# Patient Record
Sex: Female | Born: 1990 | Race: Black or African American | Hispanic: No | Marital: Single | Smoking: Former smoker
Health system: Southern US, Community
[De-identification: ages and names within clinical notes are randomized; demographics above are authoritative.]

## PROBLEM LIST (undated history)

## (undated) DIAGNOSIS — D259 Leiomyoma of uterus, unspecified: Secondary | ICD-10-CM

## (undated) DIAGNOSIS — Z789 Other specified health status: Secondary | ICD-10-CM

## (undated) DIAGNOSIS — R87629 Unspecified abnormal cytological findings in specimens from vagina: Secondary | ICD-10-CM

## (undated) HISTORY — DX: Unspecified abnormal cytological findings in specimens from vagina: R87.629

---
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
## 2009-01-07 ENCOUNTER — Emergency Department (HOSPITAL_COMMUNITY): Admission: EM | Admit: 2009-01-07 | Discharge: 2009-01-07 | Payer: Self-pay | Admitting: Family Medicine

## 2009-02-18 DIAGNOSIS — R87629 Unspecified abnormal cytological findings in specimens from vagina: Secondary | ICD-10-CM

## 2009-02-18 HISTORY — DX: Unspecified abnormal cytological findings in specimens from vagina: R87.629

## 2009-03-10 ENCOUNTER — Emergency Department (HOSPITAL_COMMUNITY): Admission: EM | Admit: 2009-03-10 | Discharge: 2009-03-10 | Payer: Self-pay | Admitting: Emergency Medicine

## 2009-12-16 ENCOUNTER — Inpatient Hospital Stay (HOSPITAL_COMMUNITY): Admission: AD | Admit: 2009-12-16 | Discharge: 2009-12-16 | Payer: Self-pay | Admitting: Obstetrics and Gynecology

## 2009-12-20 ENCOUNTER — Inpatient Hospital Stay (HOSPITAL_COMMUNITY): Admission: AD | Admit: 2009-12-20 | Discharge: 2009-12-20 | Payer: Self-pay | Admitting: Obstetrics and Gynecology

## 2009-12-22 ENCOUNTER — Inpatient Hospital Stay (HOSPITAL_COMMUNITY): Admission: AD | Admit: 2009-12-22 | Discharge: 2009-12-22 | Payer: Self-pay | Admitting: Obstetrics and Gynecology

## 2009-12-28 ENCOUNTER — Inpatient Hospital Stay (HOSPITAL_COMMUNITY): Admission: AD | Admit: 2009-12-28 | Discharge: 2009-12-31 | Payer: Self-pay | Admitting: Obstetrics and Gynecology

## 2009-12-29 ENCOUNTER — Encounter (INDEPENDENT_AMBULATORY_CARE_PROVIDER_SITE_OTHER): Payer: Self-pay | Admitting: Obstetrics and Gynecology

## 2010-01-01 ENCOUNTER — Encounter: Admission: RE | Admit: 2010-01-01 | Discharge: 2010-01-03 | Payer: Self-pay | Admitting: Physician Assistant

## 2010-05-01 LAB — CBC
HCT: 35.2 % — ABNORMAL LOW (ref 36.0–46.0)
HCT: 44.8 % (ref 36.0–46.0)
MCH: 30.1 pg (ref 26.0–34.0)
MCH: 31.2 pg (ref 26.0–34.0)
MCHC: 32.3 g/dL (ref 30.0–36.0)
MCHC: 33 g/dL (ref 30.0–36.0)
MCV: 93.3 fL (ref 78.0–100.0)
MCV: 94.5 fL (ref 78.0–100.0)
Platelets: 196 10*3/uL (ref 150–400)
RDW: 13.9 % (ref 11.5–15.5)
RDW: 14 % (ref 11.5–15.5)

## 2010-05-02 LAB — CBC
MCH: 30.9 pg (ref 26.0–34.0)
MCHC: 32.8 g/dL (ref 30.0–36.0)
RDW: 14.7 % (ref 11.5–15.5)

## 2010-05-02 LAB — URIC ACID: Uric Acid, Serum: 5.2 mg/dL (ref 2.4–7.0)

## 2010-05-02 LAB — COMPREHENSIVE METABOLIC PANEL
ALT: 15 U/L (ref 0–35)
AST: 22 U/L (ref 0–37)
Calcium: 9.2 mg/dL (ref 8.4–10.5)
Creatinine, Ser: 0.58 mg/dL (ref 0.4–1.2)
GFR calc Af Amer: >60 mL/min (ref >60)
GFR calc non Af Amer: >60 mL/min (ref >60)
Glucose, Bld: 92 mg/dL (ref 70–99)
Sodium: 135 mEq/L (ref 135–145)
Total Protein: 6.1 g/dL (ref 6.0–8.3)

## 2010-05-02 LAB — URINALYSIS, ROUTINE W REFLEX MICROSCOPIC
Bilirubin Urine: NEGATIVE
Nitrite: NEGATIVE
Specific Gravity, Urine: 1.015 (ref 1.005–1.030)
Urobilinogen, UA: 0.2 mg/dL (ref 0.0–1.0)
pH: 6.5 (ref 5.0–8.0)

## 2010-05-23 LAB — POCT URINALYSIS DIP (DEVICE)
Glucose, UA: NEGATIVE mg/dL
Ketones, ur: NEGATIVE mg/dL
Nitrite: NEGATIVE
Protein, ur: NEGATIVE mg/dL
Specific Gravity, Urine: >=1.03 (ref 1.005–1.030)
Urobilinogen, UA: 0.2 mg/dL (ref 0.0–1.0)
pH: 5.5 (ref 5.0–8.0)

## 2010-05-23 LAB — WET PREP, GENITAL: Trich, Wet Prep: NONE SEEN

## 2010-05-23 LAB — GC/CHLAMYDIA PROBE AMP, GENITAL
Chlamydia, DNA Probe: NEGATIVE
GC Probe Amp, Genital: NEGATIVE

## 2010-05-28 LAB — GC/CHLAMYDIA PROBE AMP, GENITAL
Chlamydia, DNA Probe: NEGATIVE
GC Probe Amp, Genital: NEGATIVE

## 2010-05-28 LAB — URINE MICROSCOPIC-ADD ON

## 2010-05-28 LAB — URINALYSIS, ROUTINE W REFLEX MICROSCOPIC
Bilirubin Urine: NEGATIVE
Bilirubin Urine: NEGATIVE
Glucose, UA: NEGATIVE mg/dL
Hgb urine dipstick: NEGATIVE
Ketones, ur: NEGATIVE mg/dL
Nitrite: NEGATIVE
Protein, ur: NEGATIVE mg/dL
Specific Gravity, Urine: 1.031 — ABNORMAL HIGH (ref 1.005–1.030)
Urobilinogen, UA: 1 mg/dL (ref 0.0–1.0)
pH: 5.5 (ref 5.0–8.0)
pH: 6 (ref 5.0–8.0)

## 2010-05-28 LAB — WET PREP, GENITAL: Yeast Wet Prep HPF POC: NONE SEEN

## 2010-05-28 LAB — PREGNANCY, URINE: Preg Test, Ur: NEGATIVE

## 2010-05-28 LAB — POCT PREGNANCY, URINE: Preg Test, Ur: NEGATIVE

## 2011-07-06 ENCOUNTER — Emergency Department (HOSPITAL_BASED_OUTPATIENT_CLINIC_OR_DEPARTMENT_OTHER)
Admission: EM | Admit: 2011-07-06 | Discharge: 2011-07-06 | Disposition: A | Discharge status: Home / Self Care | Payer: 59 | Attending: Emergency Medicine | Admitting: Emergency Medicine

## 2011-07-06 ENCOUNTER — Encounter (HOSPITAL_BASED_OUTPATIENT_CLINIC_OR_DEPARTMENT_OTHER): Payer: Self-pay | Admitting: Emergency Medicine

## 2011-07-06 DIAGNOSIS — B9689 Other specified bacterial agents as the cause of diseases classified elsewhere: Secondary | ICD-10-CM

## 2011-07-06 DIAGNOSIS — N760 Acute vaginitis: Secondary | ICD-10-CM | POA: Insufficient documentation

## 2011-07-06 DIAGNOSIS — A499 Bacterial infection, unspecified: Secondary | ICD-10-CM | POA: Insufficient documentation

## 2011-07-06 DIAGNOSIS — R42 Dizziness and giddiness: Secondary | ICD-10-CM | POA: Insufficient documentation

## 2011-07-06 DIAGNOSIS — R109 Unspecified abdominal pain: Secondary | ICD-10-CM | POA: Insufficient documentation

## 2011-07-06 DIAGNOSIS — R110 Nausea: Secondary | ICD-10-CM | POA: Insufficient documentation

## 2011-07-06 LAB — URINALYSIS, ROUTINE W REFLEX MICROSCOPIC
Bilirubin Urine: NEGATIVE
Glucose, UA: NEGATIVE mg/dL
Hgb urine dipstick: NEGATIVE
Ketones, ur: NEGATIVE mg/dL
Nitrite: NEGATIVE
Protein, ur: NEGATIVE mg/dL
Specific Gravity, Urine: 1.029 (ref 1.005–1.030)
Urobilinogen, UA: 1 mg/dL (ref 0.0–1.0)
pH: 6 (ref 5.0–8.0)

## 2011-07-06 LAB — WET PREP, GENITAL
Trich, Wet Prep: NONE SEEN
Yeast Wet Prep HPF POC: NONE SEEN

## 2011-07-06 LAB — URINE MICROSCOPIC-ADD ON

## 2011-07-06 LAB — PREGNANCY, URINE: Preg Test, Ur: NEGATIVE

## 2011-07-06 MED ORDER — METRONIDAZOLE 500 MG PO TABS: 500.0000 mg | ORAL_TABLET | Freq: Two times a day (BID) | ORAL | Status: AC

## 2011-07-06 MED ORDER — GI COCKTAIL ~~LOC~~
30.0000 mL | Freq: Once | ORAL | Status: AC
  Administered 2011-07-06: 30 mL via ORAL
  Filled 2011-07-06: qty 30

## 2011-07-06 MED ORDER — IBUPROFEN 400 MG PO TABS
600.0000 mg | ORAL_TABLET | Freq: Once | ORAL | Status: AC
  Administered 2011-07-06: 600 mg via ORAL
  Filled 2011-07-06: qty 1

## 2011-07-06 MED ORDER — PROMETHAZINE HCL 25 MG PO TABS: 25.0000 mg | ORAL_TABLET | Freq: Four times a day (QID) | ORAL | Status: DC | PRN

## 2011-07-06 NOTE — ED Notes (Signed)
Pt c/o abd pain (BLQ) & lightheadedness since Tues

## 2011-07-06 NOTE — ED Notes (Signed)
MD at bedside. 

## 2011-07-06 NOTE — Discharge Instructions (Signed)
Bacterial Vaginosis Bacterial vaginosis (BV) is a vaginal infection where the normal balance of bacteria in the vagina is disrupted. The normal balance is then replaced by an overgrowth of certain bacteria. There are several different kinds of bacteria that can cause BV. BV is the most common vaginal infection in women of childbearing age. CAUSES   The cause of BV is not fully understood. BV develops when there is an increase or imbalance of harmful bacteria.   Some activities or behaviors can upset the normal balance of bacteria in the vagina and put women at increased risk including:   Having a new sex partner or multiple sex partners.   Douching.   Using an intrauterine device (IUD) for contraception.   It is not clear what role sexual activity plays in the development of BV. However, women that have never had sexual intercourse are rarely infected with BV.  Women do not get BV from toilet seats, bedding, swimming pools or from touching objects around them.  SYMPTOMS   Grey vaginal discharge.   A fish-like odor with discharge, especially after sexual intercourse.   Itching or burning of the vagina and vulva.   Burning or pain with urination.   Some women have no signs or symptoms at all.  DIAGNOSIS  Your caregiver must examine the vagina for signs of BV. Your caregiver will perform lab tests and look at the sample of vaginal fluid through a microscope. They will look for bacteria and abnormal cells (clue cells), a pH test higher than 4.5, and a positive amine test all associated with BV.  RISKS AND COMPLICATIONS   Pelvic inflammatory disease (PID).   Infections following gynecology surgery.   Developing HIV.   Developing herpes virus.  TREATMENT  Sometimes BV will clear up without treatment. However, all women with symptoms of BV should be treated to avoid complications, especially if gynecology surgery is planned. Female partners generally do not need to be treated. However,  BV may spread between female sex partners so treatment is helpful in preventing a recurrence of BV.   BV may be treated with antibiotics. The antibiotics come in either pill or vaginal cream forms. Either can be used with nonpregnant or pregnant women, but the recommended dosages differ. These antibiotics are not harmful to the baby.   BV can recur after treatment. If this happens, a second round of antibiotics will often be prescribed.   Treatment is important for pregnant women. If not treated, BV can cause a premature delivery, especially for a pregnant woman who had a premature birth in the past. All pregnant women who have symptoms of BV should be checked and treated.   For chronic reoccurrence of BV, treatment with a type of prescribed gel vaginally twice a week is helpful.  HOME CARE INSTRUCTIONS   Finish all medication as directed by your caregiver.   Do not have sex until treatment is completed.   Tell your sexual partner that you have a vaginal infection. They should see their caregiver and be treated if they have problems, such as a mild rash or itching.   Practice safe sex. Use condoms. Only have 1 sex partner.  PREVENTION  Basic prevention steps can help reduce the risk of upsetting the natural balance of bacteria in the vagina and developing BV:  Do not have sexual intercourse (be abstinent).   Do not douche.   Use all of the medicine prescribed for treatment of BV, even if the signs and symptoms go away.     Tell your sex partner if you have BV. That way, they can be treated, if needed, to prevent reoccurrence.  SEEK MEDICAL CARE IF:   Your symptoms are not improving after 3 days of treatment.   You have increased discharge, pain, or fever.  MAKE SURE YOU:   Understand these instructions.   Will watch your condition.   Will get help right away if you are not doing well or get worse.  FOR MORE INFORMATION  Division of STD Prevention (DSTDP), Centers for Disease  Control and Prevention: www.cdc.gov/std American [employer] Association (ASHA): www.ashastd.org  Document Released: 02/04/2005 Document Revised: 01/24/2011 Document Reviewed: 07/28/2008 ExitCare Patient Information 2012 [employer].Abdominal Pain Abdominal pain can be caused by many things. Your caregiver decides the seriousness of your pain by an examination and possibly blood tests and X-rays. Many cases can be observed and treated at home. Most abdominal pain is not caused by a disease and will probably improve without treatment. However, in many cases, more time must pass before a clear cause of the pain can be found. Before that point, it may not be known if you need more testing, or if hospitalization or surgery is needed. HOME CARE INSTRUCTIONS   Do not take laxatives unless directed by your caregiver.   Take pain medicine only as directed by your caregiver.   Only take over-the-counter or prescription medicines for pain, discomfort, or fever as directed by your caregiver.   Try a clear liquid diet (broth, tea, or water) for as long as directed by your caregiver. Slowly move to a bland diet as tolerated.  SEEK IMMEDIATE MEDICAL CARE IF:   The pain does not go away.   You have a fever.   You keep throwing up (vomiting).   The pain is felt only in portions of the abdomen. Pain in the right side could possibly be appendicitis. In an adult, pain in the left lower portion of the abdomen could be colitis or diverticulitis.   You pass bloody or black tarry stools.  MAKE SURE YOU:   Understand these instructions.   Will watch your condition.   Will get help right away if you are not doing well or get worse.  Document Released: 11/14/2004 Document Revised: 01/24/2011 Document Reviewed: 09/23/2007 ExitCare Patient Information 2012 [employer]. 

## 2011-07-06 NOTE — ED Provider Notes (Signed)
History     CSN: [id number]  Arrival date & time 07/06/11  0848   First MD Initiated Contact with Patient 07/06/11 [phone]      Chief Complaint  Patient presents with  . Abdominal Pain  . Dizziness    (Consider location/radiation/quality/duration/timing/severity/associated sxs/prior treatment) HPI Comments: Pain not relieved by tylenol.  Crampy, not worse with eating.  She had sexual intercourse with new partner about 1 week ago, she is concerned for STD after researching her symptoms on Google.  No fevers or chills.  No dysuria, frequency.  Patient is a 21 y.o. female presenting with abdominal pain. The history is provided by the patient.  Abdominal Pain The primary symptoms of the illness include abdominal pain and nausea. The primary symptoms of the illness do not include fever, vomiting, dysuria, vaginal discharge or vaginal bleeding. The current episode started more than 2 days ago. The onset of the illness was gradual. The problem has not changed since onset. The abdominal pain is relieved by nothing.  Symptoms associated with the illness do not include chills, constipation, frequency or back pain.    History reviewed. No pertinent past medical history.  History reviewed. No pertinent past surgical history.  History reviewed. No pertinent family history.  History  Substance Use Topics  . Smoking status: Never Smoker   . Smokeless tobacco: Not on file  . Alcohol Use: No    OB History    Grav Para Term Preterm Abortions TAB SAB Ect Mult Living                  Review of Systems  Constitutional: Negative for fever and chills.  Gastrointestinal: Positive for nausea and abdominal pain. Negative for vomiting, constipation and blood in stool.  Genitourinary: Negative for dysuria, frequency, flank pain, vaginal bleeding, vaginal discharge and difficulty urinating.  Musculoskeletal: Negative for back pain.  Skin: Negative for rash.  Neurological: Negative for headaches.     Allergies  Review of patient's allergies indicates no known allergies.  Home Medications   Current Outpatient Rx  Name Route Sig Dispense Refill  . METRONIDAZOLE 500 MG PO TABS Oral Take 1 tablet (500 mg total) by mouth 2 (two) times daily. 14 tablet 0  . PROMETHAZINE HCL 25 MG PO TABS Oral Take 1 tablet (25 mg total) by mouth every 6 (six) hours as needed for nausea. 20 tablet 0    BP 116/61  Pulse 71  Temp(Src) 98.3 F (36.8 C) (Oral)  Resp 14  Ht 5\' 6"  (1.676 m)  Wt 130 lb (58.968 kg)  BMI 20.98 kg/m2  SpO2 99%  LMP 06/10/2011  Physical Exam  Nursing note and vitals reviewed. Constitutional: She is oriented to person, place, and time. She appears well-developed and well-nourished. No distress.  Eyes: No scleral icterus.  Pulmonary/Chest: Effort normal.  Abdominal: Soft. Bowel sounds are normal. She exhibits no distension. There is no tenderness. There is no rebound and no guarding.  Genitourinary: Pelvic exam was performed with patient prone. There is no lesion on the right labia. There is no lesion on the left labia. Uterus is not tender. Cervix exhibits discharge. Cervix exhibits no motion tenderness and no friability. Right adnexum displays no tenderness. Left adnexum displays no tenderness. No tenderness around the vagina. Vaginal discharge found.  Neurological: She is alert and oriented to person, place, and time.  Skin: Skin is warm and dry. No rash noted.    ED Course  Procedures (including critical care time)  Labs Reviewed  URINALYSIS, ROUTINE W REFLEX MICROSCOPIC - Abnormal; Notable for the following:    APPearance CLOUDY (*)    Leukocytes, UA SMALL (*)    All other components within normal limits  URINE MICROSCOPIC-ADD ON - Abnormal; Notable for the following:    Squamous Epithelial / LPF MANY (*)    Bacteria, UA MANY (*)    All other components within normal limits  WET PREP, GENITAL - Abnormal; Notable for the following:    Clue Cells Wet Prep HPF  POC TOO NUMEROUS TO COUNT (*)    WBC, Wet Prep HPF POC MANY (*)    All other components within normal limits  PREGNANCY, URINE  GC/CHLAMYDIA PROBE AMP, GENITAL   No results found.   1. BV (bacterial vaginosis)   2. Abdominal pain       MDM  No sig tenderness on bimanual.  Slight discharge present, wet prep shows BV.  Abd is otherwise soft, no guard or rebound.  No fever, VS normal.  Pt is reassured, non specific abd pain.  Flagyl prescribed for BV and pt can follow up with PCP.          Gavin Pound. Oletta Lamas, MD 07/06/11 2015

## 2011-07-06 NOTE — ED Notes (Signed)
Pelvic cart at bedside.  Pt in a gown.

## 2011-07-08 LAB — GC/CHLAMYDIA PROBE AMP, GENITAL
Chlamydia, DNA Probe: NEGATIVE
GC Probe Amp, Genital: NEGATIVE

## 2012-09-30 ENCOUNTER — Emergency Department (HOSPITAL_BASED_OUTPATIENT_CLINIC_OR_DEPARTMENT_OTHER)
Admission: EM | Admit: 2012-09-30 | Discharge: 2012-09-30 | Disposition: A | Discharge status: Home / Self Care | Payer: 59 | Attending: Emergency Medicine | Admitting: Emergency Medicine

## 2012-09-30 ENCOUNTER — Encounter (HOSPITAL_BASED_OUTPATIENT_CLINIC_OR_DEPARTMENT_OTHER): Payer: Self-pay | Admitting: *Deleted

## 2012-09-30 DIAGNOSIS — N898 Other specified noninflammatory disorders of vagina: Secondary | ICD-10-CM | POA: Insufficient documentation

## 2012-09-30 DIAGNOSIS — Z3202 Encounter for pregnancy test, result negative: Secondary | ICD-10-CM | POA: Insufficient documentation

## 2012-09-30 DIAGNOSIS — A499 Bacterial infection, unspecified: Secondary | ICD-10-CM | POA: Insufficient documentation

## 2012-09-30 DIAGNOSIS — N760 Acute vaginitis: Secondary | ICD-10-CM | POA: Insufficient documentation

## 2012-09-30 DIAGNOSIS — B9689 Other specified bacterial agents as the cause of diseases classified elsewhere: Secondary | ICD-10-CM | POA: Insufficient documentation

## 2012-09-30 DIAGNOSIS — N949 Unspecified condition associated with female genital organs and menstrual cycle: Secondary | ICD-10-CM | POA: Insufficient documentation

## 2012-09-30 LAB — URINALYSIS, ROUTINE W REFLEX MICROSCOPIC
Nitrite: NEGATIVE
Specific Gravity, Urine: 1.021 (ref 1.005–1.030)
pH: 6 (ref 5.0–8.0)

## 2012-09-30 LAB — URINE MICROSCOPIC-ADD ON

## 2012-09-30 LAB — PREGNANCY, URINE: Preg Test, Ur: NEGATIVE

## 2012-09-30 MED ORDER — METRONIDAZOLE 500 MG PO TABS: 500.0000 mg | ORAL_TABLET | Freq: Two times a day (BID) | ORAL | Status: DC

## 2012-09-30 NOTE — ED Notes (Signed)
Pt noticed discharge and abdominal pain 1 month ago and recently her boyfriend was diagnosed with nongonococcal urethritis and she was told to come to the ER.

## 2012-09-30 NOTE — ED Provider Notes (Signed)
Medical screening examination/treatment/procedure(s) were performed by non-physician practitioner and as supervising physician I was immediately available for consultation/collaboration.   Glynn Octave, MD 09/30/12 [phone]

## 2012-09-30 NOTE — ED Provider Notes (Signed)
CSN: [id number]     Arrival date & time 09/30/12  1919 History     First MD Initiated Contact with Patient 09/30/12 2001     Chief Complaint  Patient presents with  . Vaginal Discharge  . Abdominal Pain   (Consider location/radiation/quality/duration/timing/severity/associated sxs/prior Treatment) HPI Comments: Patient is a 22 year old female who presents with a 1 month history of abdominal pain. The pain is located in her lower abdomen and does not radiate. The pain is described as cramping and severe. The pain started gradually and progressively worsened since the onset. No alleviating/aggravating factors. The patient has tried nothing for symptoms without relief. Associated symptoms include vaginal bleeding, vaginal discharge. Patient denies fever, headache, NVD, chest pain, SOB, dysuria, constipation.  Patient is a 22 y.o. female presenting with vaginal discharge and abdominal pain.  Vaginal Discharge Associated symptoms: abdominal pain   Abdominal Pain Associated symptoms: vaginal discharge     History reviewed. No pertinent past medical history. History reviewed. No pertinent past surgical history. History reviewed. No pertinent family history. History  Substance Use Topics  . Smoking status: Never Smoker   . Smokeless tobacco: Not on file  . Alcohol Use: No   OB History   Grav Para Term Preterm Abortions TAB SAB Ect Mult Living                 Review of Systems  Gastrointestinal: Positive for abdominal pain.  Genitourinary: Positive for vaginal discharge and pelvic pain.  All other systems reviewed and are negative.    Allergies  Review of patient's allergies indicates no known allergies.  Home Medications   Current Outpatient Rx  Name  Route  Sig  Dispense  Refill  . EXPIRED: promethazine (PHENERGAN) 25 MG tablet   Oral   Take 1 tablet (25 mg total) by mouth every 6 (six) hours as needed for nausea.   20 tablet   0    BP 121/71  Pulse 77  Temp(Src) 99  F (37.2 C) (Oral)  Resp 16  SpO2 100%  LMP 09/07/2012 Physical Exam  Nursing note and vitals reviewed. Constitutional: She is oriented to person, place, and time. She appears well-developed and well-nourished. No distress.  HENT:  Head: Normocephalic and atraumatic.  Eyes: Conjunctivae and EOM are normal.  Neck: Normal range of motion.  Cardiovascular: Normal rate and regular rhythm.  Exam reveals no gallop and no friction rub.   No murmur heard. Pulmonary/Chest: Effort normal and breath sounds normal. She has no wheezes. She has no rales. She exhibits no tenderness.  Abdominal: Soft. She exhibits no distension. There is tenderness. There is no rebound and no guarding.  Suprapubic tenderness to palpation.   Genitourinary: Vagina normal.  Moderate amount of blood in vagina. Cervical os closed. No CMT. Abdominal tenderness to palpation on bimanual exam. No abnormal masses palpated.    Musculoskeletal: Normal range of motion.  Neurological: She is alert and oriented to person, place, and time. Coordination normal.  Speech is goal-oriented. Moves limbs without ataxia.   Skin: Skin is warm and dry.  Psychiatric: She has a normal mood and affect. Her behavior is normal.    ED Course   Procedures (including critical care time)  Labs Reviewed  WET PREP, GENITAL - Abnormal; Notable for the following:    Clue Cells Wet Prep HPF POC FEW (*)    WBC, Wet Prep HPF POC FEW (*)    All other components within normal limits  URINALYSIS, ROUTINE W REFLEX  MICROSCOPIC - Abnormal; Notable for the following:    Hgb urine dipstick LARGE (*)    Leukocytes, UA SMALL (*)    All other components within normal limits  URINE MICROSCOPIC-ADD ON - Abnormal; Notable for the following:    Squamous Epithelial / LPF FEW (*)    Bacteria, UA FEW (*)    All other components within normal limits  GC/CHLAMYDIA PROBE AMP  URINE CULTURE  PREGNANCY, URINE   No results found. 1. BV (bacterial vaginosis)      MDM  9:47 PM Patient's wet prep shows clue cells. Hemoglobin in urine due to menstrual cycle. Vitals stable and patient afebrile. I will treat patient with flagyl. No further evaluation needed at this time.     Emilia Beck, PA-C 09/30/12 2152

## 2012-10-01 LAB — URINE CULTURE: Colony Count: NO GROWTH

## 2012-10-01 LAB — GC/CHLAMYDIA PROBE AMP: GC Probe RNA: NEGATIVE

## 2014-10-31 ENCOUNTER — Encounter (HOSPITAL_BASED_OUTPATIENT_CLINIC_OR_DEPARTMENT_OTHER): Payer: Self-pay | Admitting: *Deleted

## 2014-10-31 ENCOUNTER — Emergency Department (HOSPITAL_BASED_OUTPATIENT_CLINIC_OR_DEPARTMENT_OTHER)
Admission: EM | Admit: 2014-10-31 | Discharge: 2014-10-31 | Disposition: A | Discharge status: Home / Self Care | Payer: BLUE CROSS/BLUE SHIELD | Attending: Emergency Medicine | Admitting: Emergency Medicine

## 2014-10-31 DIAGNOSIS — R1013 Epigastric pain: Secondary | ICD-10-CM | POA: Diagnosis not present

## 2014-10-31 DIAGNOSIS — Z79899 Other long term (current) drug therapy: Secondary | ICD-10-CM | POA: Diagnosis not present

## 2014-10-31 DIAGNOSIS — R109 Unspecified abdominal pain: Secondary | ICD-10-CM | POA: Diagnosis present

## 2014-10-31 LAB — COMPREHENSIVE METABOLIC PANEL
ALK PHOS: 45 U/L (ref 38–126)
ALT: 15 U/L (ref 14–54)
ANION GAP: 7 (ref 5–15)
AST: 19 U/L (ref 15–41)
Albumin: 3.8 g/dL (ref 3.5–5.0)
BUN: 11 mg/dL (ref 6–20)
CALCIUM: 9.7 mg/dL (ref 8.9–10.3)
CHLORIDE: 102 mmol/L (ref 101–111)
CO2: 27 mmol/L (ref 22–32)
Creatinine, Ser: 0.72 mg/dL (ref 0.44–1.00)
GFR calc non Af Amer: >60 mL/min (ref >60)
Glucose, Bld: 81 mg/dL (ref 65–99)
Potassium: 3.7 mmol/L (ref 3.5–5.1)
SODIUM: 136 mmol/L (ref 135–145)
Total Bilirubin: 0.4 mg/dL (ref 0.3–1.2)
Total Protein: 7.4 g/dL (ref 6.5–8.1)

## 2014-10-31 LAB — CBC
HCT: 37 % (ref 36.0–46.0)
Hemoglobin: 12.2 g/dL (ref 12.0–15.0)
MCH: 28 pg (ref 26.0–34.0)
MCHC: 33 g/dL (ref 30.0–36.0)
MCV: 85.1 fL (ref 78.0–100.0)
PLATELETS: 250 10*3/uL (ref 150–400)
RBC: 4.35 MIL/uL (ref 3.87–5.11)
RDW: 11.9 % (ref 11.5–15.5)
WBC: 6.9 10*3/uL (ref 4.0–10.5)

## 2014-10-31 LAB — LIPASE, BLOOD: Lipase: 37 U/L (ref 22–51)

## 2014-10-31 MED ORDER — OMEPRAZOLE 20 MG PO CPDR: 20.0000 mg | DELAYED_RELEASE_CAPSULE | Freq: Every day | ORAL | Status: DC

## 2014-10-31 MED ORDER — GI COCKTAIL ~~LOC~~
30.0000 mL | Freq: Once | ORAL | Status: AC
  Administered 2014-10-31: 30 mL via ORAL
  Filled 2014-10-31: qty 30

## 2014-10-31 MED ORDER — SUCRALFATE 1 GM/10ML PO SUSP: 1.0000 g | Freq: Three times a day (TID) | ORAL | Status: DC

## 2014-10-31 NOTE — ED Notes (Signed)
Epigastric pain x 3 days. She took tums with no relief. Feels like she has a knot in her upper abdomen.

## 2014-10-31 NOTE — ED Provider Notes (Signed)
CSN: [id number]     Arrival date & time 10/31/14  1812 History   First MD Initiated Contact with Patient 10/31/14 1822     Chief Complaint  Patient presents with  . Abdominal Pain     (Consider location/radiation/quality/duration/timing/severity/associated sxs/prior Treatment) HPI Comments: Patient presents to the emergency department with chief complaint of epigastric abdominal pain. She states that the symptoms started approximately 3 days ago. She states that she has taken toms with good relief. However she states that the symptoms return in the morning. She denies any association with eating or drinking. She denies any nausea or vomiting. Denies any burning sensation in her throat. She denies aggravating or alleviating factors. She denies any fevers or chills. Denies any prior abdominal surgeries.  The history is provided by the patient. No language interpreter was used.    History reviewed. No pertinent past medical history. History reviewed. No pertinent past surgical history. No family history on file. Social History  Substance Use Topics  . Smoking status: Never Smoker   . Smokeless tobacco: None  . Alcohol Use: No   OB History    No data available     Review of Systems  Constitutional: Negative for fever and chills.  Respiratory: Negative for shortness of breath.   Cardiovascular: Negative for chest pain.  Gastrointestinal: Positive for abdominal pain. Negative for nausea, vomiting, diarrhea and constipation.  Genitourinary: Negative for dysuria.      Allergies  Review of patient's allergies indicates no known allergies.  Home Medications   Prior to Admission medications   Medication Sig Start Date End Date Taking? Authorizing Provider  metroNIDAZOLE (FLAGYL) 500 MG tablet Take 1 tablet (500 mg total) by mouth 2 (two) times daily. 09/30/12   Kaitlyn Szekalski, PA-C  promethazine (PHENERGAN) 25 MG tablet Take 1 tablet (25 mg total) by mouth every 6 (six) hours as  needed for nausea. 07/06/11 07/13/11  Kingsley Spittle, MD   BP 115/66 mmHg  Pulse 88  Temp(Src) 98.7 F (37.1 C) (Oral)  Resp 16  Ht 5' 5.5" (1.664 m)  Wt 130 lb (58.968 kg)  BMI 21.30 kg/m2  SpO2 100%  LMP 09/20/2014 Physical Exam  Constitutional: She is oriented to person, place, and time. She appears well-developed and well-nourished.  HENT:  Head: Normocephalic and atraumatic.  Eyes: Conjunctivae and EOM are normal. Pupils are equal, round, and reactive to light.  Neck: Normal range of motion. Neck supple.  Cardiovascular: Normal rate and regular rhythm.  Exam reveals no gallop and no friction rub.   No murmur heard. Pulmonary/Chest: Effort normal and breath sounds normal. No respiratory distress. She has no wheezes. She has no rales. She exhibits no tenderness.  Abdominal: Soft. Bowel sounds are normal. She exhibits no distension and no mass. There is no tenderness. There is no rebound and no guarding.  No focal abdominal tenderness, no RLQ tenderness or pain at McBurney's point, no RUQ tenderness or Murphy's sign, no left-sided abdominal tenderness, no fluid wave, or signs of peritonitis   Musculoskeletal: Normal range of motion. She exhibits no edema or tenderness.  Neurological: She is alert and oriented to person, place, and time.  Skin: Skin is warm and dry.  Psychiatric: She has a normal mood and affect. Her behavior is normal. Judgment and thought content normal.  Nursing note and vitals reviewed.   ED Course  Procedures (including critical care time) Results for orders placed or performed during the hospital encounter of 10/31/14  CBC  Result Value Ref  Range   WBC 6.9 4.0 - 10.5 K/uL   RBC 4.35 3.87 - 5.11 MIL/uL   Hemoglobin 12.2 12.0 - 15.0 g/dL   HCT 37.0 36.0 - 46.0 %   MCV 85.1 78.0 - 100.0 fL   MCH 28.0 26.0 - 34.0 pg   MCHC 33.0 30.0 - 36.0 g/dL   RDW 11.9 11.5 - 15.5 %   Platelets 250 150 - 400 K/uL  Comprehensive metabolic panel  Result Value Ref Range    Sodium 136 135 - 145 mmol/L   Potassium 3.7 3.5 - 5.1 mmol/L   Chloride 102 101 - 111 mmol/L   CO2 27 22 - 32 mmol/L   Glucose, Bld 81 65 - 99 mg/dL   BUN 11 6 - 20 mg/dL   Creatinine, Ser 0.72 0.44 - 1.00 mg/dL   Calcium 9.7 8.9 - 10.3 mg/dL   Total Protein 7.4 6.5 - 8.1 g/dL   Albumin 3.8 3.5 - 5.0 g/dL   AST 19 15 - 41 U/L   ALT 15 14 - 54 U/L   Alkaline Phosphatase 45 38 - 126 U/L   Total Bilirubin 0.4 0.3 - 1.2 mg/dL   GFR calc non Af Amer >60 >60 mL/min   GFR calc Af Amer >60 >60 mL/min   Anion gap 7 5 - 15  Lipase, blood  Result Value Ref Range   Lipase 37 22 - 51 U/L   No results found.    MDM   Final diagnoses:  Epigastric pain    Patient with epigastric abdominal pain. Symptoms 3 days. Symptoms seem consistent with GERD/PUD disease. Will check lipase and liver function tests. Will give GI cocktail. Will reassess. Abdomen is soft and nontender. She is not in any apparent distress.  Labs are reassuring. Will treat with Carafate and omeprazole. Abdomen is soft and nontender, no evidence of surgical abdomen. Return precautions discussed.    Montine Circle, PA-C 10/31/14 2034  Blanchie Dessert, MD 11/01/14 [phone]

## 2014-10-31 NOTE — Discharge Instructions (Signed)
Abdominal Pain Many things can cause abdominal pain. Usually, abdominal pain is not caused by a disease and will improve without treatment. It can often be observed and treated at home. Your health care provider will do a physical exam and possibly order blood tests and X-rays to help determine the seriousness of your pain. However, in many cases, more time must pass before a clear cause of the pain can be found. Before that point, your health care provider may not know if you need more testing or further treatment. HOME CARE INSTRUCTIONS  Monitor your abdominal pain for any changes. The following actions may help to alleviate any discomfort you are experiencing:  Only take over-the-counter or prescription medicines as directed by your health care provider.  Do not take laxatives unless directed to do so by your health care provider.  Try a clear liquid diet (broth, tea, or water) as directed by your health care provider. Slowly move to a bland diet as tolerated. SEEK MEDICAL CARE IF:  You have unexplained abdominal pain.  You have abdominal pain associated with nausea or diarrhea.  You have pain when you urinate or have a bowel movement.  You experience abdominal pain that wakes you in the night.  You have abdominal pain that is worsened or improved by eating food.  You have abdominal pain that is worsened with eating fatty foods.  You have a fever. SEEK IMMEDIATE MEDICAL CARE IF:   Your pain does not go away within 2 hours.  You keep throwing up (vomiting).  Your pain is felt only in portions of the abdomen, such as the right side or the left lower portion of the abdomen.  You pass bloody or black tarry stools. MAKE SURE YOU:  Understand these instructions.   Will watch your condition.   Will get help right away if you are not doing well or get worse.  Document Released: 11/14/2004 Document Revised: 02/09/2013 Document Reviewed: 10/14/2012 [facility] Patient Information  2015 [place], [place]. This information is not intended to replace advice given to you by your health care provider. Make sure you discuss any questions you have with your health care provider. Food Choices for Peptic Ulcer Disease When you have peptic ulcer disease, the foods you eat and your eating habits are very important. Choosing the right foods can help ease the discomfort of peptic ulcer disease. WHAT GENERAL GUIDELINES DO I NEED TO FOLLOW?  Choose fruits, vegetables, whole grains, and low-fat meat, fish, and poultry.   Keep a food diary to identify foods that cause symptoms.  Avoid foods that cause irritation or pain. These may be different for different people.  Eat frequent small meals instead of three large meals each day. The pain may be worse when your stomach is empty.  Avoid eating close to bedtime. WHAT FOODS ARE NOT RECOMMENDED? The following are some foods and drinks that may worsen your symptoms:  Black, white, and red pepper.  Hot sauce.  Chili peppers.  Chili powder.  Chocolate and cocoa.   Alcohol.  Tea, coffee, and cola (regular and decaffeinated). The items listed above may not be a complete list of foods and beverages to avoid. Contact your dietitian for more information. Document Released: 04/29/2011 Document Revised: 02/09/2013 Document Reviewed: 12/09/2012 [facility] Patient Information 2015 [place], [place]. This information is not intended to replace advice given to you by your health care provider. Make sure you discuss any questions you have with your health care provider. Peptic Ulcer A peptic ulcer is  a sore in the lining of your esophagus (esophageal ulcer), stomach (gastric ulcer), or in the first part of your small intestine (duodenal ulcer). The ulcer causes erosion into the deeper tissue. CAUSES  Normally, the lining of the stomach and the small intestine protects itself from the acid that digests food. The protective lining can be damaged  by:  An infection caused by a bacterium called Helicobacter pylori (H. pylori).  Regular use of nonsteroidal anti-inflammatory drugs (NSAIDs), such as ibuprofen or aspirin.  Smoking tobacco. Other risk factors include being older than 63, drinking alcohol excessively, and having a family history of ulcer disease.  SYMPTOMS   Burning pain or gnawing in the area between the chest and the belly button.  Heartburn.  Nausea and vomiting.  Bloating. The pain can be worse on an empty stomach and at night. If the ulcer results in bleeding, it can cause:  Black, tarry stools.  Vomiting of bright red blood.  Vomiting of coffee-ground-looking materials. DIAGNOSIS  A diagnosis is usually made based upon your history and an exam. Other tests and procedures may be performed to find the cause of the ulcer. Finding a cause will help determine the best treatment. Tests and procedures may include:  Blood tests, stool tests, or breath tests to check for the bacterium H. pylori.  An upper gastrointestinal (GI) series of the esophagus, stomach, and small intestine.  An endoscopy to examine the esophagus, stomach, and small intestine.  A biopsy. TREATMENT  Treatment may include:  Eliminating the cause of the ulcer, such as smoking, NSAIDs, or alcohol.  Medicines to reduce the amount of acid in your digestive tract.  Antibiotic medicines if the ulcer is caused by the H. pylori bacterium.  An upper endoscopy to treat a bleeding ulcer.  Surgery if the bleeding is severe or if the ulcer created a hole somewhere in the digestive system. HOME CARE INSTRUCTIONS   Avoid tobacco, alcohol, and caffeine. Smoking can increase the acid in the stomach, and continued smoking will impair the healing of ulcers.  Avoid foods and drinks that seem to cause discomfort or aggravate your ulcer.  Only take medicines as directed by your caregiver. Do not substitute over-the-counter medicines for prescription  medicines without talking to your caregiver.  Keep any follow-up appointments and tests as directed. SEEK MEDICAL CARE IF:   Your do not improve within 7 days of starting treatment.  You have ongoing indigestion or heartburn. SEEK IMMEDIATE MEDICAL CARE IF:   You have sudden, sharp, or persistent abdominal pain.  You have bloody or dark black, tarry stools.  You vomit blood or vomit that looks like coffee grounds.  You become light-headed, weak, or feel faint.  You become sweaty or clammy. MAKE SURE YOU:   Understand these instructions.  Will watch your condition.  Will get help right away if you are not doing well or get worse. Document Released: 02/02/2000 Document Revised: 06/21/2013 Document Reviewed: 09/04/2011 [facility] Patient Information 2015 [place], [place]. This information is not intended to replace advice given to you by your health care provider. Make sure you discuss any questions you have with your health care provider.

## 2015-05-28 ENCOUNTER — Encounter (HOSPITAL_BASED_OUTPATIENT_CLINIC_OR_DEPARTMENT_OTHER): Payer: Self-pay

## 2015-05-28 ENCOUNTER — Emergency Department (HOSPITAL_BASED_OUTPATIENT_CLINIC_OR_DEPARTMENT_OTHER)
Admission: EM | Admit: 2015-05-28 | Discharge: 2015-05-28 | Disposition: A | Discharge status: Home / Self Care | Payer: BLUE CROSS/BLUE SHIELD | Attending: Emergency Medicine | Admitting: Emergency Medicine

## 2015-05-28 DIAGNOSIS — Z3202 Encounter for pregnancy test, result negative: Secondary | ICD-10-CM | POA: Insufficient documentation

## 2015-05-28 DIAGNOSIS — N898 Other specified noninflammatory disorders of vagina: Secondary | ICD-10-CM | POA: Insufficient documentation

## 2015-05-28 DIAGNOSIS — Z79899 Other long term (current) drug therapy: Secondary | ICD-10-CM | POA: Insufficient documentation

## 2015-05-28 DIAGNOSIS — Z792 Long term (current) use of antibiotics: Secondary | ICD-10-CM | POA: Diagnosis not present

## 2015-05-28 DIAGNOSIS — N390 Urinary tract infection, site not specified: Secondary | ICD-10-CM | POA: Diagnosis not present

## 2015-05-28 DIAGNOSIS — Z202 Contact with and (suspected) exposure to infections with a predominantly sexual mode of transmission: Secondary | ICD-10-CM | POA: Diagnosis not present

## 2015-05-28 LAB — URINALYSIS, ROUTINE W REFLEX MICROSCOPIC
Bilirubin Urine: NEGATIVE
GLUCOSE, UA: NEGATIVE mg/dL
Hgb urine dipstick: NEGATIVE
KETONES UR: NEGATIVE mg/dL
Nitrite: POSITIVE — AB
PH: 6 (ref 5.0–8.0)
Protein, ur: NEGATIVE mg/dL
Specific Gravity, Urine: 1.025 (ref 1.005–1.030)

## 2015-05-28 LAB — URINE MICROSCOPIC-ADD ON: RBC / HPF: NONE SEEN RBC/hpf (ref 0–5)

## 2015-05-28 LAB — PREGNANCY, URINE: Preg Test, Ur: NEGATIVE

## 2015-05-28 MED ORDER — AZITHROMYCIN 250 MG PO TABS
1000.0000 mg | ORAL_TABLET | Freq: Once | ORAL | Status: AC
  Administered 2015-05-28: 1000 mg via ORAL
  Filled 2015-05-28: qty 4

## 2015-05-28 MED ORDER — LIDOCAINE HCL (PF) 1 % IJ SOLN
INTRAMUSCULAR | Status: AC
  Administered 2015-05-28: 13:00:00
  Filled 2015-05-28: qty 5

## 2015-05-28 MED ORDER — CEFTRIAXONE SODIUM 250 MG IJ SOLR
250.0000 mg | Freq: Once | INTRAMUSCULAR | Status: AC
  Administered 2015-05-28: 250 mg via INTRAMUSCULAR
  Filled 2015-05-28: qty 250

## 2015-05-28 MED ORDER — NITROFURANTOIN MONOHYD MACRO 100 MG PO CAPS
100.0000 mg | ORAL_CAPSULE | Freq: Two times a day (BID) | ORAL | Status: DC
Start: 2015-05-28 — End: 2015-09-27

## 2015-05-28 NOTE — ED Notes (Signed)
Pt reports lower abdominal pain since Friday. Reports boyfriend told her that he had chlamydia. Pt sts foul odor. Denies discharge or nausea.

## 2015-05-28 NOTE — ED Notes (Signed)
EDP at bedside  

## 2015-05-28 NOTE — ED Provider Notes (Signed)
CSN: [id number]     Arrival date & time 05/28/15  1132 History   First MD Initiated Contact with Patient 05/28/15 1140     Chief Complaint  Patient presents with  . Exposure to STD  . Abdominal Pain     (Consider location/radiation/quality/duration/timing/severity/associated sxs/prior Treatment) HPI Patient presents to the emergency department with dysuria along with vaginal discharge.  The patient states that her boyfriend states that he was tested positive for chlamydia.  Patient states that nothing seems to make the condition better or worse.The patient denies chest pain, shortness of breath, headache,blurred vision, neck pain, fever, cough, weakness, numbness, dizziness, anorexia, edema, nausea, vomiting, diarrhea, rash, back pain, hematemesis, bloody stool, near syncope, or syncope.    History reviewed. No pertinent past medical history. History reviewed. No pertinent past surgical history. No family history on file. Social History  Substance Use Topics  . Smoking status: Never Smoker   . Smokeless tobacco: None  . Alcohol Use: No   OB History    No data available     Review of Systems  All other systems negative except as documented in the HPI. All pertinent positives and negatives as reviewed in the HPI.  Allergies  Review of patient's allergies indicates no known allergies.  Home Medications   Prior to Admission medications   Medication Sig Start Date End Date Taking? Authorizing Provider  metroNIDAZOLE (FLAGYL) 500 MG tablet Take 1 tablet (500 mg total) by mouth 2 (two) times daily. 09/30/12   Kaitlyn Szekalski, PA-C  omeprazole (PRILOSEC) 20 MG capsule Take 1 capsule (20 mg total) by mouth daily. 10/31/14   Montine Circle, PA-C  promethazine (PHENERGAN) 25 MG tablet Take 1 tablet (25 mg total) by mouth every 6 (six) hours as needed for nausea. 07/06/11 07/13/11  Kingsley Spittle, MD  sucralfate (CARAFATE) 1 GM/10ML suspension Take 10 mLs (1 g total) by mouth 4 (four) times  daily -  with meals and at bedtime. 10/31/14   Montine Circle, PA-C   BP 133/101 mmHg  Pulse 110  Temp(Src) 99.3 F (37.4 C) (Oral)  Resp 18  Ht 5\' 5"  (1.651 m)  Wt 65.772 kg  BMI 24.13 kg/m2  SpO2 100%  LMP 05/21/2015 Physical Exam  Constitutional: She is oriented to person, place, and time. She appears well-developed and well-nourished. No distress.  HENT:  Head: Normocephalic and atraumatic.  Mouth/Throat: Oropharynx is clear and moist.  Eyes: Pupils are equal, round, and reactive to light.  Neck: Normal range of motion. Neck supple.  Cardiovascular: Normal rate, regular rhythm and normal heart sounds.  Exam reveals no gallop and no friction rub.   No murmur heard. Pulmonary/Chest: Effort normal and breath sounds normal. No respiratory distress. She has no wheezes.  Abdominal: Soft. Bowel sounds are normal. She exhibits no distension. There is tenderness. There is no rebound and no guarding.    Genitourinary:  Patient refuses pelvic exam and would just like treatment for STDs  Neurological: She is alert and oriented to person, place, and time. She exhibits normal muscle tone. Coordination normal.  Skin: Skin is warm and dry. No rash noted. No erythema.  Psychiatric: She has a normal mood and affect. Her behavior is normal.  Nursing note and vitals reviewed.   ED Course  Procedures (including critical care time) Labs Review Labs Reviewed  URINALYSIS, ROUTINE W REFLEX MICROSCOPIC (NOT AT [facility]) - Abnormal; Notable for the following:    Color, Urine AMBER (*)    APPearance CLOUDY (*)  Nitrite POSITIVE (*)    Leukocytes, UA SMALL (*)    All other components within normal limits  URINE MICROSCOPIC-ADD ON - Abnormal; Notable for the following:    Squamous Epithelial / LPF 0-5 (*)    Bacteria, UA MANY (*)    All other components within normal limits  PREGNANCY, URINE  GC/CHLAMYDIA PROBE AMP ([facility]) NOT AT [facility]    Imaging Review No results found. I have  personally reviewed and evaluated these images and lab results as part of my medical decision-making.  Patient be treated for STDs along with a UTI.  Told to return here as needed.  Patient agrees the plan and all questions were answered   Dalia Heading, PA-C 05/28/15 [place], MD 05/28/15 [phone]

## 2015-05-28 NOTE — Discharge Instructions (Signed)
Return here as needed.  Follow up with a primary care doctor °

## 2015-09-27 ENCOUNTER — Encounter (HOSPITAL_BASED_OUTPATIENT_CLINIC_OR_DEPARTMENT_OTHER): Payer: Self-pay

## 2015-09-27 ENCOUNTER — Emergency Department (HOSPITAL_BASED_OUTPATIENT_CLINIC_OR_DEPARTMENT_OTHER)
Admission: EM | Admit: 2015-09-27 | Discharge: 2015-09-27 | Disposition: A | Discharge status: Home / Self Care | Payer: BLUE CROSS/BLUE SHIELD | Attending: Emergency Medicine | Admitting: Emergency Medicine

## 2015-09-27 DIAGNOSIS — A64 Unspecified sexually transmitted disease: Secondary | ICD-10-CM | POA: Insufficient documentation

## 2015-09-27 DIAGNOSIS — Z202 Contact with and (suspected) exposure to infections with a predominantly sexual mode of transmission: Secondary | ICD-10-CM

## 2015-09-27 LAB — URINALYSIS, ROUTINE W REFLEX MICROSCOPIC
BILIRUBIN URINE: NEGATIVE
Glucose, UA: NEGATIVE mg/dL
HGB URINE DIPSTICK: NEGATIVE
KETONES UR: 15 mg/dL — AB
Leukocytes, UA: NEGATIVE
NITRITE: NEGATIVE
PROTEIN: NEGATIVE mg/dL
SPECIFIC GRAVITY, URINE: 1.026 (ref 1.005–1.030)
pH: 5.5 (ref 5.0–8.0)

## 2015-09-27 LAB — PREGNANCY, URINE: PREG TEST UR: NEGATIVE

## 2015-09-27 MED ORDER — METRONIDAZOLE 500 MG PO TABS
2000.0000 mg | ORAL_TABLET | Freq: Once | ORAL | Status: AC
  Administered 2015-09-27: 2000 mg via ORAL
  Filled 2015-09-27: qty 4

## 2015-09-27 MED ORDER — AZITHROMYCIN 250 MG PO TABS
1000.0000 mg | ORAL_TABLET | Freq: Once | ORAL | Status: AC
  Administered 2015-09-27: 1000 mg via ORAL
  Filled 2015-09-27: qty 4

## 2015-09-27 MED ORDER — CEFTRIAXONE SODIUM 250 MG IJ SOLR
250.0000 mg | Freq: Once | INTRAMUSCULAR | Status: AC
  Administered 2015-09-27: 250 mg via INTRAMUSCULAR
  Filled 2015-09-27: qty 250

## 2015-09-27 NOTE — ED Provider Notes (Signed)
[facility] [place] DEPT [facility] Provider Note   CSN: [id number] Arrival date & time: 09/27/15  [id number]  First Provider Contact:   First MD Initiated Contact with Patient 09/27/15 1946      By signing my name below, I, Soijett Blue, attest that this documentation has been prepared under the direction and in the presence of Charlesetta Shanks, MD. Electronically Signed: Soijett Blue, ED Scribe. 09/27/15. 7:50 PM.   History   Chief Complaint Chief Complaint  Patient presents with  . Exposure to STD    HPI Nicole Frank is a 25 y.o. female who presents to the Emergency Department complaining of exposure to STD onset 2 days. Pt states that her boyfriend came into the ED 2 days ago and was informed that he had chlamydia today. Pt reports that she came into the ED for evaluation and treatment. She states that she is having associated symptoms of abdominal cramping. She states that she has not tried any medications for the relief for her symptoms. She denies vaginal discharge, n/v, fever, chills, dysuria, and any other symptoms.    The history is provided by the patient. No language interpreter was used.    History reviewed. No pertinent past medical history.  There are no active problems to display for this patient.   History reviewed. No pertinent surgical history.  OB History    No data available       Home Medications    Prior to Admission medications   Medication Sig Start Date End Date Taking? Authorizing Provider  promethazine (PHENERGAN) 25 MG tablet Take 1 tablet (25 mg total) by mouth every 6 (six) hours as needed for nausea. 07/06/11 07/13/11  Kingsley Spittle, MD    Family History No family history on file.  Social History Social History  Substance Use Topics  . Smoking status: Never Smoker  . Smokeless tobacco: Never Used  . Alcohol use No     Allergies   Review of patient's allergies indicates no known allergies.   Review of Systems Review of Systems  A complete  10 system review of systems was obtained and all systems are negative except as noted in the HPI and PMH.   Physical Exam Updated Vital Signs BP 129/90 (BP Location: Left Arm)   Pulse 94   Temp 99 F (37.2 C) (Oral)   Resp 18   Ht 5\' 6"  (1.676 m)   Wt 153 lb (69.4 kg)   LMP 09/19/2015   SpO2 100%   BMI 24.69 kg/m   Physical Exam  Constitutional: She is oriented to person, place, and time. She appears well-developed and well-nourished. No distress.  HENT:  Head: Normocephalic and atraumatic.  Eyes: EOM are normal.  Neck: Neck supple.  Cardiovascular: Normal rate, regular rhythm and normal heart sounds.  Exam reveals no gallop and no friction rub.   No murmur heard. Pulmonary/Chest: Effort normal and breath sounds normal. No respiratory distress. She has no wheezes. She has no rales.  Abdominal: Soft. She exhibits no distension. There is no tenderness.  Musculoskeletal: Normal range of motion.  Neurological: She is alert and oriented to person, place, and time.  Skin: Skin is warm and dry.  Psychiatric: She has a normal mood and affect. Her behavior is normal.  Nursing note and vitals reviewed.    ED Treatments / Results  DIAGNOSTIC STUDIES: Oxygen Saturation is 100% on RA, nl by my interpretation.    COORDINATION OF CARE: 7:49 PM Discussed treatment plan with pt at bedside which  includes UA, HIV antibody, RPR, rocephin, zithromax, and pt agreed to plan.   Labs (all labs ordered are listed, but only abnormal results are displayed) Labs Reviewed  URINALYSIS, ROUTINE W REFLEX MICROSCOPIC (NOT AT [facility]) - Abnormal; Notable for the following:       Result Value   Ketones, ur 15 (*)    All other components within normal limits  PREGNANCY, URINE  RPR  HIV ANTIBODY (ROUTINE TESTING)    Procedures Procedures (including critical care time)  Medications Ordered in ED Medications  cefTRIAXone (ROCEPHIN) injection 250 mg (not administered)  azithromycin (ZITHROMAX) tablet  1,000 mg (not administered)  metroNIDAZOLE (FLAGYL) tablet 2,000 mg (not administered)     Initial Impression / Assessment and Plan / ED Course  I have reviewed the triage vital signs and the nursing notes.  Pertinent labs that were available during my care of the patient were reviewed by me and considered in my medical decision making (see chart for details).  Clinical Course    Final Clinical Impressions(s) / ED Diagnoses   Final diagnoses:  STD exposure   Patient denies any pain or symptoms. She is presenting because her partner has been notified of positive Chlamydia. She denies any abdominal pain on examination and no vaginal discharge. At this time she'll be empirically treated for Chlamydia, gonorrhea and trichomonas. HIV testing will be obtained. Patient is advised to follow-up with her gynecologist for recheck. New Prescriptions New Prescriptions   No medications on file        Charlesetta Shanks, MD 09/27/15 2003

## 2015-09-27 NOTE — ED Notes (Signed)
Pt in no pain denies d/c or pain.  States boyfriend was seen the other night and he got a call saying he had STD

## 2015-09-27 NOTE — ED Triage Notes (Signed)
Pt states her boyfriend pos for STD-pt denies vaginal d/c, pain-NAD-steady gait

## 2015-09-29 LAB — RPR: RPR: NONREACTIVE

## 2015-09-29 LAB — HIV ANTIBODY (ROUTINE TESTING W REFLEX): HIV SCREEN 4TH GENERATION: NONREACTIVE

## 2016-03-04 ENCOUNTER — Emergency Department (HOSPITAL_BASED_OUTPATIENT_CLINIC_OR_DEPARTMENT_OTHER): Payer: Managed Care, Other (non HMO)

## 2016-03-04 ENCOUNTER — Emergency Department (HOSPITAL_BASED_OUTPATIENT_CLINIC_OR_DEPARTMENT_OTHER)
Admission: EM | Admit: 2016-03-04 | Discharge: 2016-03-04 | Disposition: A | Discharge status: Home / Self Care | Payer: Managed Care, Other (non HMO) | Attending: Emergency Medicine | Admitting: Emergency Medicine

## 2016-03-04 ENCOUNTER — Encounter (HOSPITAL_BASED_OUTPATIENT_CLINIC_OR_DEPARTMENT_OTHER): Payer: Self-pay | Admitting: *Deleted

## 2016-03-04 DIAGNOSIS — O200 Threatened abortion: Secondary | ICD-10-CM | POA: Insufficient documentation

## 2016-03-04 DIAGNOSIS — N939 Abnormal uterine and vaginal bleeding, unspecified: Secondary | ICD-10-CM

## 2016-03-04 DIAGNOSIS — O23591 Infection of other part of genital tract in pregnancy, first trimester: Secondary | ICD-10-CM | POA: Diagnosis present

## 2016-03-04 DIAGNOSIS — R109 Unspecified abdominal pain: Secondary | ICD-10-CM

## 2016-03-04 DIAGNOSIS — Z3A01 Less than 8 weeks gestation of pregnancy: Secondary | ICD-10-CM | POA: Diagnosis not present

## 2016-03-04 LAB — URINALYSIS, MICROSCOPIC (REFLEX)

## 2016-03-04 LAB — URINALYSIS, ROUTINE W REFLEX MICROSCOPIC
BILIRUBIN URINE: NEGATIVE
GLUCOSE, UA: NEGATIVE mg/dL
KETONES UR: NEGATIVE mg/dL
NITRITE: NEGATIVE
PH: 6.5 (ref 5.0–8.0)
PROTEIN: NEGATIVE mg/dL
Specific Gravity, Urine: 1.026 (ref 1.005–1.030)

## 2016-03-04 LAB — HCG, QUANTITATIVE, PREGNANCY: HCG, BETA CHAIN, QUANT, S: 30425 m[IU]/mL — AB (ref <5)

## 2016-03-04 LAB — PREGNANCY, URINE: PREG TEST UR: POSITIVE — AB

## 2016-03-04 LAB — ABO/RH: ABO/RH(D): B POS

## 2016-03-04 LAB — WET PREP, GENITAL
Clue Cells Wet Prep HPF POC: NONE SEEN
Sperm: NONE SEEN
TRICH WET PREP: NONE SEEN

## 2016-03-04 NOTE — ED Triage Notes (Signed)
C/o low abd pain. Vaginal discharge of brown color. No painful or frequent urination. Onset 2 days ago.

## 2016-03-04 NOTE — ED Notes (Signed)
Dr. Tamera Punt notified of pt's blood type B+

## 2016-03-04 NOTE — ED Provider Notes (Signed)
[facility] Provider Note   CSN: [id number] Arrival date & time: 03/04/16  1042     History   Chief Complaint Chief Complaint  Patient presents with  . Vaginal Discharge    HPI Nicole Frank is a 26 y.o. female.  Patient presents with vaginal discharge. She notes that she's had a worsening discharge her about 3-4 days. She also has had some lower abdominal cramping. She denies any urinary symptoms. No nausea vomiting or fevers. Her last menstrual period was November 15. She is sexually active. She doesn't use protection. She does have a history of bacterial vaginosis and Chlamydia in the past.      History reviewed. No pertinent past medical history.  There are no active problems to display for this patient.   History reviewed. No pertinent surgical history.  OB History    No data available       Home Medications    Prior to Admission medications   Not on File    Family History No family history on file.  Social History Social History  Substance Use Topics  . Smoking status: Never Smoker  . Smokeless tobacco: Never Used  . Alcohol use No     Allergies   Patient has no known allergies.   Review of Systems Review of Systems  Constitutional: Negative for chills, diaphoresis, fatigue and fever.  HENT: Negative for congestion, rhinorrhea and sneezing.   Eyes: Negative.   Respiratory: Negative for cough, chest tightness and shortness of breath.   Cardiovascular: Negative for chest pain and leg swelling.  Gastrointestinal: Positive for abdominal pain. Negative for blood in stool, diarrhea, nausea and vomiting.  Genitourinary: Positive for vaginal discharge. Negative for difficulty urinating, flank pain, frequency, hematuria and vaginal pain.  Musculoskeletal: Negative for arthralgias and back pain.  Skin: Negative for rash.  Neurological: Negative for dizziness, speech difficulty, weakness, numbness and headaches.     Physical  Exam Updated Vital Signs BP 108/67 (BP Location: Right Arm)   Pulse 98   Temp 98.7 F (37.1 C) (Oral)   Resp 18   Ht 5\' 5"  (1.651 m)   Wt 145 lb (65.8 kg)   LMP 01/03/2016   SpO2 100%   BMI 24.13 kg/m   Physical Exam  Constitutional: She is oriented to person, place, and time. She appears well-developed and well-nourished.  HENT:  Head: Normocephalic and atraumatic.  Eyes: Pupils are equal, round, and reactive to light.  Neck: Normal range of motion. Neck supple.  Cardiovascular: Normal rate, regular rhythm and normal heart sounds.   Pulmonary/Chest: Effort normal and breath sounds normal. No respiratory distress. She has no wheezes. She has no rales. She exhibits no tenderness.  Abdominal: Soft. Bowel sounds are normal. There is no tenderness. There is no rebound and no guarding.  Genitourinary:  Genitourinary Comments: No bleeding.  No CMT or adnexal tenderness.  No discharge.  Musculoskeletal: Normal range of motion. She exhibits no edema.  Lymphadenopathy:    She has no cervical adenopathy.  Neurological: She is alert and oriented to person, place, and time.  Skin: Skin is warm and dry. No rash noted.  Psychiatric: She has a normal mood and affect.     ED Treatments / Results  Labs (all labs ordered are listed, but only abnormal results are displayed) Labs Reviewed  WET PREP, GENITAL - Abnormal; Notable for the following:       Result Value   Yeast Wet Prep HPF POC PRESENT (*)  WBC, Wet Prep HPF POC MANY (*)    All other components within normal limits  PREGNANCY, URINE - Abnormal; Notable for the following:    Preg Test, Ur POSITIVE (*)    All other components within normal limits  URINALYSIS, ROUTINE W REFLEX MICROSCOPIC - Abnormal; Notable for the following:    APPearance CLOUDY (*)    Hgb urine dipstick SMALL (*)    Leukocytes, UA SMALL (*)    All other components within normal limits  HCG, QUANTITATIVE, PREGNANCY - Abnormal; Notable for the following:     hCG, Beta Chain, Quant, S 30,425 (*)    All other components within normal limits  URINALYSIS, MICROSCOPIC (REFLEX) - Abnormal; Notable for the following:    Bacteria, UA MANY (*)    Squamous Epithelial / LPF 6-30 (*)    All other components within normal limits  RPR  HIV ANTIBODY (ROUTINE TESTING)  ABO/RH  GC/CHLAMYDIA PROBE AMP ([place]) NOT AT [facility]    EKG  EKG Interpretation None       Radiology US Ob Comp < 14 Wks  Result Date: 03/04/2016 CLINICAL DATA:  First trimester vaginal bleeding. Estimated gestational age by last menstrual period equals 8 weeks 6 days EXAM: OBSTETRIC <14 WK [place] AND TRANSVAGINAL OB US TECHNIQUE: Both transabdominal and transvaginal ultrasound examinations were performed for complete evaluation of the gestation as well as the maternal uterus, adnexal regions, and pelvic cul-de-sac. Transvaginal technique was performed to assess early pregnancy. COMPARISON:  None. FINDINGS: Intrauterine gestational sac: Present Yolk sac:  Present Embryo:  Not identified Cardiac Activity: Not identified MSD:  12   mm   6 w   0 d Subchorionic hemorrhage:  None visualized. Maternal uterus/adnexae: Corpus luteal cyst of the RIGHT ovary. Normal LEFT ovary. No free fluid IMPRESSION: 1. Intrauterine gestation sac with yolk sac. No fetal pole. Differential includes early intrauterine gestation versus spontaneous abortion. Recommend follow-up ultrasound in 10-14 days. 2. Estimated gestational age by mean sac diameter equals 6 weeks 0 days. Electronically Signed   By: Suzy Bouchard M.D.   On: 03/04/2016 13:12   US Ob Transvaginal  Result Date: 03/04/2016 CLINICAL DATA:  First trimester vaginal bleeding. Estimated gestational age by last menstrual period equals 8 weeks 6 days EXAM: OBSTETRIC <14 WK [place] AND TRANSVAGINAL OB US TECHNIQUE: Both transabdominal and transvaginal ultrasound examinations were performed for complete evaluation of the gestation as well as the maternal uterus,  adnexal regions, and pelvic cul-de-sac. Transvaginal technique was performed to assess early pregnancy. COMPARISON:  None. FINDINGS: Intrauterine gestational sac: Present Yolk sac:  Present Embryo:  Not identified Cardiac Activity: Not identified MSD:  12   mm   6 w   0 d Subchorionic hemorrhage:  None visualized. Maternal uterus/adnexae: Corpus luteal cyst of the RIGHT ovary. Normal LEFT ovary. No free fluid IMPRESSION: 1. Intrauterine gestation sac with yolk sac. No fetal pole. Differential includes early intrauterine gestation versus spontaneous abortion. Recommend follow-up ultrasound in 10-14 days. 2. Estimated gestational age by mean sac diameter equals 6 weeks 0 days. Electronically Signed   By: Suzy Bouchard M.D.   On: 03/04/2016 13:12    Procedures Procedures (including critical care time)  Medications Ordered in ED Medications - No data to display   Initial Impression / Assessment and Plan / ED Course  I have reviewed the triage vital signs and the nursing notes.  Pertinent labs & imaging results that were available during my care of the patient were reviewed by me  and considered in my medical decision making (see chart for details).  Clinical Course     Patient is a 26 year old who presents with vaginal discharge. There is no visible discharge on exam. There is no bleeding on pelvic exam. Ultrasound shows an intrauterine gestational sac. No fetus seen.  Advised pt to f/u with Dr. Micah Noel, her ob/gyn.  Return precautions given. Wet prep shows yeast but she's not symptomatic.  Urinalysis appears to be a dirty specimen. Again she is not symptomatic for UTI.  Final Clinical Impressions(s) / ED Diagnoses   Final diagnoses:  Vaginal bleeding  Abdominal pain  Threatened abortion    New Prescriptions New Prescriptions   No medications on file     Malvin Johns, MD 03/04/16 1331

## 2016-03-04 NOTE — ED Notes (Signed)
Signature pad not working in room 4.  Pt verbally acknowledged her d/c paperwork.  No questions.

## 2016-03-05 LAB — GC/CHLAMYDIA PROBE AMP (~~LOC~~) NOT AT ARMC
CHLAMYDIA, DNA PROBE: NEGATIVE
NEISSERIA GONORRHEA: NEGATIVE

## 2016-03-05 LAB — HIV ANTIBODY (ROUTINE TESTING W REFLEX): HIV Screen 4th Generation wRfx: NONREACTIVE

## 2016-03-05 LAB — RPR: RPR: NONREACTIVE

## 2016-03-09 ENCOUNTER — Inpatient Hospital Stay (HOSPITAL_COMMUNITY)
Admission: AD | Admit: 2016-03-09 | Discharge: 2016-03-09 | Disposition: A | Discharge status: Home / Self Care | Payer: Managed Care, Other (non HMO) | Source: Ambulatory Visit | Attending: Obstetrics and Gynecology | Admitting: Obstetrics and Gynecology

## 2016-03-09 ENCOUNTER — Encounter (HOSPITAL_COMMUNITY): Payer: Self-pay

## 2016-03-09 DIAGNOSIS — R102 Pelvic and perineal pain: Secondary | ICD-10-CM

## 2016-03-09 DIAGNOSIS — O209 Hemorrhage in early pregnancy, unspecified: Secondary | ICD-10-CM | POA: Diagnosis present

## 2016-03-09 DIAGNOSIS — O26892 Other specified pregnancy related conditions, second trimester: Secondary | ICD-10-CM | POA: Diagnosis not present

## 2016-03-09 DIAGNOSIS — O26891 Other specified pregnancy related conditions, first trimester: Secondary | ICD-10-CM

## 2016-03-09 DIAGNOSIS — Z9889 Other specified postprocedural states: Secondary | ICD-10-CM | POA: Diagnosis not present

## 2016-03-09 DIAGNOSIS — Z3A09 9 weeks gestation of pregnancy: Secondary | ICD-10-CM | POA: Insufficient documentation

## 2016-03-09 DIAGNOSIS — R1031 Right lower quadrant pain: Secondary | ICD-10-CM | POA: Diagnosis not present

## 2016-03-09 HISTORY — DX: Other specified health status: Z78.9

## 2016-03-09 LAB — URINALYSIS, ROUTINE W REFLEX MICROSCOPIC
Bilirubin Urine: NEGATIVE
Glucose, UA: NEGATIVE mg/dL
Hgb urine dipstick: NEGATIVE
Ketones, ur: NEGATIVE mg/dL
LEUKOCYTES UA: NEGATIVE
NITRITE: NEGATIVE
PH: 6.5 (ref 5.0–8.0)
Protein, ur: NEGATIVE mg/dL
SPECIFIC GRAVITY, URINE: 1.02 (ref 1.005–1.030)

## 2016-03-09 LAB — HCG, QUANTITATIVE, PREGNANCY: hCG, Beta Chain, Quant, S: 77272 m[IU]/mL — ABNORMAL HIGH (ref <5)

## 2016-03-09 MED ORDER — FAMOTIDINE IN NACL 20-0.9 MG/50ML-% IV SOLN: 20.0000 mg | Freq: Once | INTRAVENOUS | Status: DC

## 2016-03-09 MED ORDER — SOD CITRATE-CITRIC ACID 500-334 MG/5ML PO SOLN: 30.0000 mL | Freq: Once | ORAL | Status: DC

## 2016-03-09 NOTE — MAU Note (Signed)
Patient presents with right side lower abdominal pain, told pregnant, had [place] did not see a baby, still having pink/brown discharge on and off.

## 2016-03-09 NOTE — MAU Provider Note (Signed)
Chief Complaint: Abdominal Pain   First Provider Initiated Contact with Patient 03/09/16 1031        SUBJECTIVE HPI: Nicole Frank is a 26 y.o. [id number] at [redacted]w[redacted]d by LMP who presents to maternity admissions reporting continued lower pelvic pressure and discomfort, states "just a little pressure".  Was seen in ED 5 days ago and US showed Gestational sac with no fetal pole.  Also had pink spotting that day which continues. Went to see Dr Micah Noel that same day, states he repeated the HCG level and it "was lower".  He plans repeat US next week.  Did not call him today, states ED told her to come here instead. . She denies vaginal itching/burning, urinary symptoms, h/a, dizziness, n/v, or fever/chills.    She insists on getting another HCG today.  Discussed that now that we have a gestational sac on [place], repeating HCG levels would not tell us anything unless they go down significantly   She insists on getting one anyway.   Abdominal Pain  This is a recurrent problem. The current episode started in the past 7 days. The onset quality is gradual. The problem occurs intermittently. The problem has been unchanged. The pain is located in the suprapubic region. The pain is mild. The quality of the pain is cramping. The abdominal pain does not radiate. Pertinent negatives include no constipation, diarrhea, dysuria, fever, headaches, myalgias, nausea or vomiting. Nothing aggravates the pain. The pain is relieved by nothing. She has tried nothing for the symptoms.    RN Note: Patient presents with right side lower abdominal pain, told pregnant, had [place] did not see a baby, still having pink/brown discharge on and off.   Past Medical History:  Diagnosis Date  . Medical history non-contributory   . Preterm labor    Past Surgical History:  Procedure Laterality Date  . CESAREAN SECTION     Social History   Social History  . Marital status: Single    Spouse name: N/A  . Number of children: N/A  . Years of  education: N/A   Occupational History  . Not on file.   Social History Main Topics  . Smoking status: Never Smoker  . Smokeless tobacco: Never Used  . Alcohol use No  . Drug use: No  . Sexual activity: Yes    Birth control/ protection: Patch   Other Topics Concern  . Not on file   Social History Narrative  . No narrative on file   No current facility-administered medications on file prior to encounter.    No current outpatient prescriptions on file prior to encounter.   No Known Allergies  I have reviewed patient's Past Medical Hx, Surgical Hx, Family Hx, Social Hx, medications and allergies.   ROS:  Review of Systems  Constitutional: Negative for chills and fever.  Gastrointestinal: Positive for abdominal pain. Negative for constipation, diarrhea, nausea and vomiting.  Genitourinary: Positive for pelvic pain and vaginal bleeding. Negative for dysuria and vaginal discharge.  Musculoskeletal: Negative for back pain and myalgias.  Neurological: Negative for headaches.   Review of Systems  Other systems negative   Physical Exam  Physical Exam Patient Vitals for the past 24 hrs:  BP Temp Pulse Resp Height Weight  03/09/16 0944 130/70 98.6 F (37 C) 99 18 - -  03/09/16 0940 - - - - 5\' 5"  (1.651 m) 161 lb (73 kg)   Constitutional: Well-developed, well-nourished female in no acute distress.  Cardiovascular: normal rate Respiratory: normal effort GI:  Abd soft, non-tender. Pos BS x 4 MS: Extremities nontender, no edema, normal ROM Neurologic: Alert and oriented x 4.  GU: Neg CVAT.  PELVIC EXAM: Cervix long and closed. No blood seen   There is no adnexal tenderness.   LAB RESULTS Results for orders placed or performed during the hospital encounter of 03/09/16 (from the past 24 hour(s))  Urinalysis, Routine w reflex microscopic     Status: Abnormal   Collection Time: 03/09/16  9:39 AM  Result Value Ref Range   Color, Urine YELLOW YELLOW   APPearance HAZY (A) CLEAR    Specific Gravity, Urine 1.020 1.005 - 1.030   pH 6.5 5.0 - 8.0   Glucose, UA NEGATIVE NEGATIVE mg/dL   Hgb urine dipstick NEGATIVE NEGATIVE   Bilirubin Urine NEGATIVE NEGATIVE   Ketones, ur NEGATIVE NEGATIVE mg/dL   Protein, ur NEGATIVE NEGATIVE mg/dL   Nitrite NEGATIVE NEGATIVE   Leukocytes, UA NEGATIVE NEGATIVE  hCG, quantitative, pregnancy     Status: Abnormal   Collection Time: 03/09/16 11:02 AM  Result Value Ref Range   hCG, Beta Chain, Quant, S 77,272 (H) <5 mIU/mL    Ref. Range 03/04/2016 11:09  HCG, Beta Chain, Quant, S Latest Ref Range: <5 mIU/mL 30,425 (H)    --/--/B POS (01/15 1109)  IMAGING US Ob Comp < 14 Wks  Result Date: 03/04/2016 CLINICAL DATA:  First trimester vaginal bleeding. Estimated gestational age by last menstrual period equals 8 weeks 6 days EXAM: OBSTETRIC <14 WK [place] AND TRANSVAGINAL OB US TECHNIQUE: Both transabdominal and transvaginal ultrasound examinations were performed for complete evaluation of the gestation as well as the maternal uterus, adnexal regions, and pelvic cul-de-sac. Transvaginal technique was performed to assess early pregnancy. COMPARISON:  None. FINDINGS: Intrauterine gestational sac: Present Yolk sac:  Present Embryo:  Not identified Cardiac Activity: Not identified MSD:  12   mm   6 w   0 d Subchorionic hemorrhage:  None visualized. Maternal uterus/adnexae: Corpus luteal cyst of the RIGHT ovary. Normal LEFT ovary. No free fluid IMPRESSION: 1. Intrauterine gestation sac with yolk sac. No fetal pole. Differential includes early intrauterine gestation versus spontaneous abortion. Recommend follow-up ultrasound in 10-14 days. 2. Estimated gestational age by mean sac diameter equals 6 weeks 0 days. Electronically Signed   By: Suzy Bouchard M.D.   On: 03/04/2016 13:12   US Ob Transvaginal  Result Date: 03/04/2016 CLINICAL DATA:  First trimester vaginal bleeding. Estimated gestational age by last menstrual period equals 8 weeks 6 days EXAM:  OBSTETRIC <14 WK [place] AND TRANSVAGINAL OB US TECHNIQUE: Both transabdominal and transvaginal ultrasound examinations were performed for complete evaluation of the gestation as well as the maternal uterus, adnexal regions, and pelvic cul-de-sac. Transvaginal technique was performed to assess early pregnancy. COMPARISON:  None. FINDINGS: Intrauterine gestational sac: Present Yolk sac:  Present Embryo:  Not identified Cardiac Activity: Not identified MSD:  12   mm   6 w   0 d Subchorionic hemorrhage:  None visualized. Maternal uterus/adnexae: Corpus luteal cyst of the RIGHT ovary. Normal LEFT ovary. No free fluid IMPRESSION: 1. Intrauterine gestation sac with yolk sac. No fetal pole. Differential includes early intrauterine gestation versus spontaneous abortion. Recommend follow-up ultrasound in 10-14 days. 2. Estimated gestational age by mean sac diameter equals 6 weeks 0 days. Electronically Signed   By: Suzy Bouchard M.D.   On: 03/04/2016 13:12    MAU Management/MDM: Patient elected to leave prior to return of HCG result.  Agrees to call Dr Micah Noel  Monday for f/u   After she left it was noted to have risen. It cannot be determined at what rate it should rise at this point, as the levels sometimes vary at this point. Discussed this with patient, prior to drawing it. .    This bleeding/pain can represent a normal pregnancy with bleeding, spontaneous abortion or even an ectopic which can be life-threatening.  The process as listed above helps to determine which of these is present.    ASSESSMENT Pregnancy at [redacted]w[redacted]d by LMP Questionable rise in HCG level Empty sac 5 days ago, unclear as to what this means. Could be early pregnancy or an abnormal one.   PLAN Discharge home Patient to call Dr Micah Noel Monday and follow up Discussed he does not practice at this hospital   If she prefers him, she needs to call him and go to [facility]  Ectopic precautions   Pt stable at time of  discharge. Encouraged to return here or to other Urgent Care/ED if she develops worsening of symptoms, increase in pain, fever, or other concerning symptoms.    Hansel Feinstein CNM, MSN Certified Nurse-Midwife 03/09/2016  10:55 AM

## 2016-03-09 NOTE — Discharge Instructions (Signed)
Vaginal Bleeding During Pregnancy, First Trimester °A small amount of bleeding (spotting) from the vagina is common in early pregnancy. Sometimes the bleeding is normal and is not a problem, and sometimes it is a sign of something serious. Be sure to tell your doctor about any bleeding from your vagina right away. °Follow these instructions at home: °· Watch your condition for any changes. °· Follow your doctor's instructions about how active you can be. °· If you are on bed rest: °¨ You may need to stay in bed and only get up to use the bathroom. °¨ You may be allowed to do some activities. °¨ If you need help, make plans for someone to help you. °· Write down: °¨ The number of pads you use each day. °¨ How often you change pads. °¨ How soaked (saturated) your pads are. °· Do not use tampons. °· Do not douche. °· Do not have sex or orgasms until your doctor says it is okay. °· If you pass any tissue from your vagina, save the tissue so you can show it to your doctor. °· Only take medicines as told by your doctor. °· Do not take aspirin because it can make you bleed. °· Keep all follow-up visits as told by your doctor. °Contact a doctor if: °· You bleed from your vagina. °· You have cramps. °· You have labor pains. °· You have a fever that does not go away after you take medicine. °Get help right away if: °· You have very bad cramps in your back or belly (abdomen). °· You pass large clots or tissue from your vagina. °· You bleed more. °· You feel light-headed or weak. °· You pass out (faint). °· You have chills. °· You are leaking fluid or have a gush of fluid from your vagina. °· You pass out while pooping (having a bowel movement). °This information is not intended to replace advice given to you by your health care provider. Make sure you discuss any questions you have with your health care provider. °Document Released: 06/21/2013 Document Revised: 07/13/2015 Document Reviewed: 10/12/2012 °[employer]vier Interactive  Patient Education © 2017 [employer] ° °

## 2016-07-18 ENCOUNTER — Encounter (HOSPITAL_BASED_OUTPATIENT_CLINIC_OR_DEPARTMENT_OTHER): Payer: Self-pay | Admitting: Emergency Medicine

## 2016-07-18 ENCOUNTER — Emergency Department (HOSPITAL_BASED_OUTPATIENT_CLINIC_OR_DEPARTMENT_OTHER)
Admission: EM | Admit: 2016-07-18 | Discharge: 2016-07-18 | Disposition: A | Discharge status: Home / Self Care | Payer: 59 | Attending: Emergency Medicine | Admitting: Emergency Medicine

## 2016-07-18 DIAGNOSIS — N3000 Acute cystitis without hematuria: Secondary | ICD-10-CM | POA: Insufficient documentation

## 2016-07-18 DIAGNOSIS — Z202 Contact with and (suspected) exposure to infections with a predominantly sexual mode of transmission: Secondary | ICD-10-CM | POA: Diagnosis not present

## 2016-07-18 DIAGNOSIS — N760 Acute vaginitis: Secondary | ICD-10-CM | POA: Insufficient documentation

## 2016-07-18 DIAGNOSIS — B9689 Other specified bacterial agents as the cause of diseases classified elsewhere: Secondary | ICD-10-CM

## 2016-07-18 DIAGNOSIS — N898 Other specified noninflammatory disorders of vagina: Secondary | ICD-10-CM | POA: Diagnosis present

## 2016-07-18 LAB — URINALYSIS, ROUTINE W REFLEX MICROSCOPIC
BILIRUBIN URINE: NEGATIVE
Glucose, UA: NEGATIVE mg/dL
HGB URINE DIPSTICK: NEGATIVE
KETONES UR: NEGATIVE mg/dL
Nitrite: POSITIVE — AB
PROTEIN: NEGATIVE mg/dL
SPECIFIC GRAVITY, URINE: 1.02 (ref 1.005–1.030)
pH: 7.5 (ref 5.0–8.0)

## 2016-07-18 LAB — WET PREP, GENITAL
Sperm: NONE SEEN
Trich, Wet Prep: NONE SEEN
YEAST WET PREP: NONE SEEN

## 2016-07-18 LAB — URINALYSIS, MICROSCOPIC (REFLEX): RBC / HPF: NONE SEEN RBC/hpf (ref 0–5)

## 2016-07-18 LAB — PREGNANCY, URINE: PREG TEST UR: NEGATIVE

## 2016-07-18 MED ORDER — AZITHROMYCIN 250 MG PO TABS
1000.0000 mg | ORAL_TABLET | Freq: Once | ORAL | Status: AC
  Administered 2016-07-18: 1000 mg via ORAL
  Filled 2016-07-18: qty 4

## 2016-07-18 MED ORDER — CEFTRIAXONE SODIUM 250 MG IJ SOLR
250.0000 mg | Freq: Once | INTRAMUSCULAR | Status: AC
  Administered 2016-07-18: 250 mg via INTRAMUSCULAR
  Filled 2016-07-18: qty 250

## 2016-07-18 MED ORDER — LIDOCAINE HCL (PF) 1 % IJ SOLN
INTRAMUSCULAR | Status: AC
  Administered 2016-07-18: 1 mL
  Filled 2016-07-18: qty 5

## 2016-07-18 MED ORDER — METRONIDAZOLE 500 MG PO TABS: 500.0000 mg | ORAL_TABLET | Freq: Two times a day (BID) | ORAL | 0 refills | Status: DC

## 2016-07-18 MED ORDER — CEPHALEXIN 500 MG PO CAPS: 500.0000 mg | ORAL_CAPSULE | Freq: Two times a day (BID) | ORAL | 0 refills | Status: DC

## 2016-07-18 NOTE — ED Triage Notes (Signed)
Patient states that she was exposed to Chlamydia from her boyfriend

## 2016-07-18 NOTE — ED Provider Notes (Signed)
[facility] Provider Note   CSN: [id number] Arrival date & time: 07/18/16  1700   By signing my name below, I, Nicole Frank, attest that this documentation has been prepared under the direction and in the presence of Nicole Ramus, PA-C. Electronically Signed: Eunice Frank, Scribe. 07/18/16. 6:43 PM.   History   Chief Complaint Chief Complaint  Patient presents with  . Exposure to STD   The history is provided by the patient and medical records. No language interpreter was used.    Nicole Frank is a 26 y.o. female presenting to the Emergency Department with chief complaint of vaginal discharge onset 3 weeks ago s/p recent suspected chlamydia exposure. Pt also c/o mild pelvic pains recently. Vaginal discharge described as white/blood tinged. Recent sexual activity noted without barrier with a female partner with whom she has been monogamous x > 6 months. Pt states this partner informed her that he recently tested positive for chlamydia. She notes h/o BV with similar symptoms in the past, states she normally relieved this with an undisclosed topical gel and states she has used this x 3 days without relief. LNMP ended 6 days ago after 5 days. No h/o abdominal surgeries noted. No fever, SOB, dysuria, bloody stool/ urine, N/V/D, hematuria or rash noted. No other complaints at this time.  Past Medical History:  Diagnosis Date  . Medical history non-contributory   . Preterm labor     There are no active problems to display for this patient.   Past Surgical History:  Procedure Laterality Date  . CESAREAN SECTION      OB History    Gravida Para Term Preterm AB Living   5       2 2    SAB TAB Ectopic Multiple Live Births   1 1     2        Home Medications    Prior to Admission medications   Medication Sig Start Date End Date Taking? Authorizing Provider  cephALEXin (KEFLEX) 500 MG capsule Take 1 capsule (500 mg total) by mouth 2 (two) times daily. 07/18/16    Nona Dell, PA-C  metroNIDAZOLE (FLAGYL) 500 MG tablet Take 1 tablet (500 mg total) by mouth 2 (two) times daily. 07/18/16   Nona Dell, PA-C    Family History History reviewed. No pertinent family history.  Social History Social History  Substance Use Topics  . Smoking status: Never Smoker  . Smokeless tobacco: Never Used  . Alcohol use No     Allergies   Patient has no known allergies.   Review of Systems Review of Systems  Constitutional: Negative for fever.  Respiratory: Negative for shortness of breath.   Gastrointestinal: Negative for diarrhea, nausea and vomiting.  Genitourinary: Positive for pelvic pain, vaginal bleeding and vaginal discharge. Negative for dysuria and hematuria.  Skin: Negative for rash.  All other systems reviewed and are negative.    Physical Exam Updated Vital Signs BP 125/72 (BP Location: Left Arm)   Pulse 80   Temp 98.2 F (36.8 C) (Oral)   Resp 16   Ht 5\' 6"  (1.676 m)   Wt 150 lb (68 kg)   LMP 01/03/2016   SpO2 100%   Breastfeeding? Unknown   BMI 24.21 kg/m   Physical Exam  Constitutional: She is oriented to person, place, and time. She appears well-developed and well-nourished.  HENT:  Head: Normocephalic and atraumatic.  No oral lesions  Eyes: Conjunctivae and EOM are normal. Right eye exhibits no  discharge. Left eye exhibits no discharge. No scleral icterus.  Neck: Normal range of motion. Neck supple.  Cardiovascular: Normal rate, regular rhythm, normal heart sounds and intact distal pulses.   Pulmonary/Chest: Effort normal and breath sounds normal. No respiratory distress. She has no wheezes. She has no rales. She exhibits no tenderness.  Abdominal: Soft. Bowel sounds are normal. She exhibits no distension and no mass. There is no tenderness. There is no rebound and no guarding.  Musculoskeletal: Normal range of motion. She exhibits no edema.  Neurological: She is alert and oriented to person,  place, and time.  Skin: Skin is warm and dry.  Nursing note and vitals reviewed.  Pelvic exam: normal external genitalia, vulva, vagina, cervix, uterus and adnexa, VULVA: normal appearing vulva with no masses, tenderness or lesions, VAGINA: normal appearing vagina with normal color and discharge, no lesions, vaginal discharge - white, copious and thick, CERVIX: normal appearing cervix without discharge or lesions, WET MOUNT done - results: clue cells, white blood cells, DNA probe for chlamydia and GC obtained, UTERUS: uterus is normal size, shape, consistency and nontender, ADNEXA: normal adnexa in size, nontender and no masses, exam chaperoned by female nurse.   ED Treatments / Results  DIAGNOSTIC STUDIES: Oxygen Saturation is 100% on RA, NL by my interpretation.    COORDINATION OF CARE: 5:47 PM-Discussed next steps with pt. Pt verbalized understanding and is agreeable with the plan. Will order labs.   Labs (all labs ordered are listed, but only abnormal results are displayed) Labs Reviewed  WET PREP, GENITAL - Abnormal; Notable for the following:       Result Value   Clue Cells Wet Prep HPF POC PRESENT (*)    WBC, Wet Prep HPF POC MODERATE (*)    All other components within normal limits  URINALYSIS, ROUTINE W REFLEX MICROSCOPIC - Abnormal; Notable for the following:    APPearance TURBID (*)    Nitrite POSITIVE (*)    Leukocytes, UA MODERATE (*)    All other components within normal limits  URINALYSIS, MICROSCOPIC (REFLEX) - Abnormal; Notable for the following:    Bacteria, UA MANY (*)    Squamous Epithelial / LPF 6-30 (*)    All other components within normal limits  PREGNANCY, URINE  RPR  HIV ANTIBODY (ROUTINE TESTING)  GC/CHLAMYDIA PROBE AMP () NOT AT [facility]    EKG  EKG Interpretation None       Radiology No results found.  Procedures Procedures (including critical care time)  Medications Ordered in ED Medications  azithromycin (ZITHROMAX) tablet 1,000  mg (1,000 mg Oral Given 07/18/16 1825)  cefTRIAXone (ROCEPHIN) injection 250 mg (250 mg Intramuscular Given 07/18/16 1826)  lidocaine (PF) (XYLOCAINE) 1 % injection (1 mL  Given 07/18/16 1826)     Initial Impression / Assessment and Plan / ED Course  I have reviewed the triage vital signs and the nursing notes.  Pertinent labs & imaging results that were available during my care of the patient were reviewed by me and considered in my medical decision making (see chart for details).     Patient to be discharged with instructions to follow up with OBGYN. Discussed importance of using protection when sexually active. Pt understands that they have GC/Chlamydia cultures pending and that they will need to inform all sexual partners if results return positive. Pt has been treated prophylacticly with azithromycin and rocephin due to pts history, pelvic exam, and wet prep with increased WBCs and clue cells. Pt not concerning for  PID because hemodynamically stable and no cervical motion tenderness on pelvic exam. Pt has also been treated with flagyl for Bacterial Vaginosis and keflex for UTI. Pt has been advised to not drink alcohol while on this medication.    Final Clinical Impressions(s) / ED Diagnoses   Final diagnoses:  STD exposure  Acute cystitis without hematuria  BV (bacterial vaginosis)    New Prescriptions New Prescriptions   CEPHALEXIN (KEFLEX) 500 MG CAPSULE    Take 1 capsule (500 mg total) by mouth 2 (two) times daily.   METRONIDAZOLE (FLAGYL) 500 MG TABLET    Take 1 tablet (500 mg total) by mouth 2 (two) times daily.   I personally performed the services described in this documentation, which was scribed in my presence. The recorded information has been reviewed and is accurate.    Nona Dell, PA-C 07/18/16 1847    Tegeler, Gwenyth Allegra, MD 07/19/16 0040

## 2016-07-18 NOTE — Discharge Instructions (Signed)
1. Medications: Take your medications as prescribed for your UTI and bacterial vaginosis infection. 2. Treatment: rest, drink plenty of fluids, use a condom with every sexual encounter 3. Follow Up: Please followup with your primary doctor in 3 days for discussion of your diagnoses and further evaluation after today's visit; if you do not have a primary care doctor use the resource guide provided to find one; Please return to the ER for worsening symptoms, high fevers or persistent vomiting.  You have been tested for HIV, syphilis, chlamydia and gonorrhea.  These results will be available in approximately 3 days.  Please inform all sexual partners if you test positive for any of these diseases.

## 2016-07-18 NOTE — ED Notes (Signed)
ED Provider at bedside. 

## 2016-07-19 LAB — HIV ANTIBODY (ROUTINE TESTING W REFLEX): HIV Screen 4th Generation wRfx: NONREACTIVE

## 2016-07-19 LAB — GC/CHLAMYDIA PROBE AMP (~~LOC~~) NOT AT ARMC
CHLAMYDIA, DNA PROBE: NEGATIVE
NEISSERIA GONORRHEA: NEGATIVE

## 2016-07-19 LAB — RPR: RPR: NONREACTIVE

## 2016-09-26 ENCOUNTER — Emergency Department (HOSPITAL_BASED_OUTPATIENT_CLINIC_OR_DEPARTMENT_OTHER): Admission: EM | Admit: 2016-09-26 | Payer: 59 | Source: Home / Self Care

## 2016-09-28 ENCOUNTER — Encounter (HOSPITAL_COMMUNITY): Payer: Self-pay | Admitting: *Deleted

## 2016-09-28 ENCOUNTER — Inpatient Hospital Stay (HOSPITAL_COMMUNITY)
Admission: AD | Admit: 2016-09-28 | Discharge: 2016-09-28 | Disposition: A | Discharge status: Home / Self Care | Payer: 59 | Source: Ambulatory Visit | Attending: Family Medicine | Admitting: Family Medicine

## 2016-09-28 DIAGNOSIS — Z9889 Other specified postprocedural states: Secondary | ICD-10-CM | POA: Diagnosis not present

## 2016-09-28 DIAGNOSIS — Z79899 Other long term (current) drug therapy: Secondary | ICD-10-CM | POA: Diagnosis not present

## 2016-09-28 DIAGNOSIS — R1031 Right lower quadrant pain: Secondary | ICD-10-CM

## 2016-09-28 DIAGNOSIS — N912 Amenorrhea, unspecified: Secondary | ICD-10-CM | POA: Insufficient documentation

## 2016-09-28 DIAGNOSIS — Z3202 Encounter for pregnancy test, result negative: Secondary | ICD-10-CM | POA: Diagnosis not present

## 2016-09-28 LAB — CBC
HEMATOCRIT: 39.1 % (ref 36.0–46.0)
Hemoglobin: 12.8 g/dL (ref 12.0–15.0)
MCH: 28.9 pg (ref 26.0–34.0)
MCHC: 32.7 g/dL (ref 30.0–36.0)
MCV: 88.3 fL (ref 78.0–100.0)
PLATELETS: 270 10*3/uL (ref 150–400)
RBC: 4.43 MIL/uL (ref 3.87–5.11)
RDW: 13.8 % (ref 11.5–15.5)
WBC: 6.5 10*3/uL (ref 4.0–10.5)

## 2016-09-28 LAB — URINALYSIS, ROUTINE W REFLEX MICROSCOPIC
Bilirubin Urine: NEGATIVE
Glucose, UA: NEGATIVE mg/dL
Hgb urine dipstick: NEGATIVE
Ketones, ur: NEGATIVE mg/dL
Nitrite: NEGATIVE
PH: 6 (ref 5.0–8.0)
Protein, ur: NEGATIVE mg/dL
SPECIFIC GRAVITY, URINE: 1.026 (ref 1.005–1.030)

## 2016-09-28 LAB — WET PREP, GENITAL
Clue Cells Wet Prep HPF POC: NONE SEEN
SPERM: NONE SEEN
Trich, Wet Prep: NONE SEEN
YEAST WET PREP: NONE SEEN

## 2016-09-28 LAB — POCT PREGNANCY, URINE: Preg Test, Ur: NEGATIVE

## 2016-09-28 NOTE — Discharge Instructions (Signed)
Secondary Amenorrhea Secondary amenorrhea is the stopping of menstrual flow for 3-6 months in a female who has previously had periods. There are many possible causes. Most of these causes are not serious. Usually, treating the underlying problem causing the loss of menses will return your periods to normal. What are the causes? Some common and uncommon causes of not menstruating include:  Malnutrition.  Low blood sugar (hypoglycemia).  Polycystic ovary disease.  Stress or fear.  Breastfeeding.  Hormone imbalance.  Ovarian failure.  Medicines.  Extreme obesity.  Cystic fibrosis.  Low body weight or drastic weight reduction from any cause.  Early menopause.  Removal of ovaries or uterus.  Contraceptives.  Illness.  Long-term (chronic) illnesses.  Cushing syndrome.  Thyroid problems.  Birth control pills, patches, or vaginal rings for birth control.  What increases the risk? You may be at greater risk of secondary amenorrhea if:  You have a family history of this condition.  You have an eating disorder.  You do athletic training.  How is this diagnosed? A diagnosis is made by your health care provider taking a medical history and doing a physical exam. This will include a pelvic exam to check for problems with your reproductive organs. Pregnancy must be ruled out. Often, numerous blood tests are done to measure different hormones in the body. Urine testing may be done. Specialized exams (ultrasound, CT scan, MRI, or hysteroscopy) may have to be done as well as measuring the body mass index (BMI). How is this treated? Treatment depends on the cause of the amenorrhea. If an eating disorder is present, this can be treated with an adequate diet and therapy. Chronic illnesses may improve with treatment of the illness. Amenorrhea may be corrected with medicines, lifestyle changes, or surgery. If the amenorrhea cannot be corrected, it is sometimes possible to create a  false menstruation with medicines. Follow these instructions at home:  Maintain a healthy diet.  Manage weight problems.  Exercise regularly but not excessively.  Get adequate sleep.  Manage stress.  Be aware of changes in your menstrual cycle. Keep a record of when your periods occur. Note the date your period starts, how long it lasts, and any problems. Contact a health care provider if: Your symptoms do not get better with treatment. This information is not intended to replace advice given to you by your health care provider. Make sure you discuss any questions you have with your health care provider. Document Released: 03/18/2006 Document Revised: 07/13/2015 Document Reviewed: 07/23/2012 Elsevier Interactive Patient Education  2018 Elsevier Inc.  

## 2016-09-28 NOTE — MAU Provider Note (Signed)
History     CSN: [id number]  Arrival date and time: 09/28/16 9509   First Provider Initiated Contact with Patient 09/28/16 [phone]      Chief Complaint  Patient presents with  . Possible Pregnancy  . Abdominal Pain   26 y.o AA female here with missed menses and RLQ pain. Pain started 2 days ago. She describes as intermittent, pulling, and rates 5/10. She used ibuprofen and had good relief. No fevers. No urinary sx. No vaginal discharge. No new partners. She had 2 negative HPTs. She is not planning for pregnancy but is not using contraception.    Past Medical History:  Diagnosis Date  . Medical history non-contributory   . Preterm labor     Past Surgical History:  Procedure Laterality Date  . CESAREAN SECTION      History reviewed. No pertinent family history.  Social History  Substance Use Topics  . Smoking status: Never Smoker  . Smokeless tobacco: Never Used  . Alcohol use No    Allergies: No Known Allergies  Prescriptions Prior to Admission  Medication Sig Dispense Refill Last Dose  . cephALEXin (KEFLEX) 500 MG capsule Take 1 capsule (500 mg total) by mouth 2 (two) times daily. 14 capsule 0   . metroNIDAZOLE (FLAGYL) 500 MG tablet Take 1 tablet (500 mg total) by mouth 2 (two) times daily. 14 tablet 0     Review of Systems  Constitutional: Negative for chills and fever.  Gastrointestinal: Positive for abdominal pain. Negative for constipation, diarrhea, nausea and vomiting.  Genitourinary: Negative for dysuria, frequency, urgency, vaginal bleeding and vaginal discharge.   Physical Exam   Blood pressure 130/73, pulse 81, temperature 99.1 F (37.3 C), temperature source Oral, resp. rate 16, weight 147 lb 12 oz (67 kg), last menstrual period 08/09/2016, SpO2 100 %, unknown if currently breastfeeding.  Physical Exam  Nursing note and vitals reviewed. Constitutional: She is oriented to person, place, and time. She appears well-developed and well-nourished.  HENT:   Head: Normocephalic and atraumatic.  Neck: Normal range of motion.  Cardiovascular: Normal rate.   Respiratory: Effort normal. No respiratory distress.  GI: Soft. She exhibits no distension and no mass. There is no tenderness. There is no rebound and no guarding.  Genitourinary:  Genitourinary Comments: External: no lesions or erythema Vagina: rugated, pink, moist, scant white discharge Uterus: non enlarged, anteverted, non tender, no CMT Adnexae: no masses, no tenderness left, no tenderness right   Musculoskeletal: Normal range of motion.  Neurological: She is alert and oriented to person, place, and time.  Skin: Skin is warm and dry.  Psychiatric: She has a normal mood and affect.   Results for orders placed or performed during the hospital encounter of 09/28/16 (from the past 24 hour(s))  Urinalysis, Routine w reflex microscopic     Status: Abnormal   Collection Time: 09/28/16  8:44 AM  Result Value Ref Range   Color, Urine YELLOW YELLOW   APPearance HAZY (A) CLEAR   Specific Gravity, Urine 1.026 1.005 - 1.030   pH 6.0 5.0 - 8.0   Glucose, UA NEGATIVE NEGATIVE mg/dL   Hgb urine dipstick NEGATIVE NEGATIVE   Bilirubin Urine NEGATIVE NEGATIVE   Ketones, ur NEGATIVE NEGATIVE mg/dL   Protein, ur NEGATIVE NEGATIVE mg/dL   Nitrite NEGATIVE NEGATIVE   Leukocytes, UA SMALL (A) NEGATIVE   RBC / HPF 0-5 0 - 5 RBC/hpf   WBC, UA 6-30 0 - 5 WBC/hpf   Bacteria, UA RARE (A) NONE SEEN  Squamous Epithelial / LPF 0-5 (A) NONE SEEN   Mucous PRESENT   Pregnancy, urine POC     Status: None   Collection Time: 09/28/16  8:50 AM  Result Value Ref Range   Preg Test, Ur NEGATIVE NEGATIVE  Wet prep, genital     Status: Abnormal   Collection Time: 09/28/16  9:13 AM  Result Value Ref Range   Yeast Wet Prep HPF POC NONE SEEN NONE SEEN   Trich, Wet Prep NONE SEEN NONE SEEN   Clue Cells Wet Prep HPF POC NONE SEEN NONE SEEN   WBC, Wet Prep HPF POC FEW (A) NONE SEEN   Sperm NONE SEEN   CBC      Status: None   Collection Time: 09/28/16  9:21 AM  Result Value Ref Range   WBC 6.5 4.0 - 10.5 K/uL   RBC 4.43 3.87 - 5.11 MIL/uL   Hemoglobin 12.8 12.0 - 15.0 g/dL   HCT 39.1 36.0 - 46.0 %   MCV 88.3 78.0 - 100.0 fL   MCH 28.9 26.0 - 34.0 pg   MCHC 32.7 30.0 - 36.0 g/dL   RDW 13.8 11.5 - 15.5 %   Platelets 270 150 - 400 K/uL   MAU Course  Procedures  MDM Labs ordered and reviewed. No evidence of pregnancy. No evidence of acute abdominal or pelvic process. Discussed with pt that an occasional missed menses may occur during reproductive years. Recommend HPT weekly until menses or positive test. Stable for discharge home.   Assessment and Plan   1. Amenorrhea   2. Pregnancy examination or test, negative result   3. RLQ abdominal pain    Discharge home Follow up with OBGYN provider of choice if sx persist Take PNV daily- since not using contraception Return to MAU for OBGYN emergencies  Allergies as of 09/28/2016   No Known Allergies     Medication List    STOP taking these medications   cephALEXin 500 MG capsule Commonly known as:  KEFLEX   metroNIDAZOLE 500 MG tablet Commonly known as:  Jeanene Erb, CNM 09/28/2016, 9:19 AM

## 2016-09-28 NOTE — MAU Note (Signed)
Missed her cycle last month.  Having some cramping in lower abd, started 2 days ago.  Denies bleeding. Had a blighted ovum in Jan.

## 2016-09-30 LAB — GC/CHLAMYDIA PROBE AMP (~~LOC~~) NOT AT ARMC
Chlamydia: NEGATIVE
Neisseria Gonorrhea: NEGATIVE

## 2017-05-06 ENCOUNTER — Other Ambulatory Visit: Payer: Self-pay

## 2017-05-06 ENCOUNTER — Emergency Department (HOSPITAL_BASED_OUTPATIENT_CLINIC_OR_DEPARTMENT_OTHER)
Admission: EM | Admit: 2017-05-06 | Discharge: 2017-05-06 | Disposition: A | Discharge status: Home / Self Care | Payer: 59 | Attending: Emergency Medicine | Admitting: Emergency Medicine

## 2017-05-06 ENCOUNTER — Encounter (HOSPITAL_BASED_OUTPATIENT_CLINIC_OR_DEPARTMENT_OTHER): Payer: Self-pay | Admitting: Emergency Medicine

## 2017-05-06 DIAGNOSIS — F17200 Nicotine dependence, unspecified, uncomplicated: Secondary | ICD-10-CM | POA: Diagnosis not present

## 2017-05-06 DIAGNOSIS — B9689 Other specified bacterial agents as the cause of diseases classified elsewhere: Secondary | ICD-10-CM

## 2017-05-06 DIAGNOSIS — R8290 Unspecified abnormal findings in urine: Secondary | ICD-10-CM | POA: Insufficient documentation

## 2017-05-06 DIAGNOSIS — H0102A Squamous blepharitis right eye, upper and lower eyelids: Secondary | ICD-10-CM | POA: Insufficient documentation

## 2017-05-06 DIAGNOSIS — N3001 Acute cystitis with hematuria: Secondary | ICD-10-CM

## 2017-05-06 DIAGNOSIS — N760 Acute vaginitis: Secondary | ICD-10-CM | POA: Insufficient documentation

## 2017-05-06 DIAGNOSIS — N898 Other specified noninflammatory disorders of vagina: Secondary | ICD-10-CM | POA: Diagnosis not present

## 2017-05-06 DIAGNOSIS — H579 Unspecified disorder of eye and adnexa: Secondary | ICD-10-CM | POA: Diagnosis present

## 2017-05-06 LAB — URINALYSIS, MICROSCOPIC (REFLEX)

## 2017-05-06 LAB — WET PREP, GENITAL
SPERM: NONE SEEN
Trich, Wet Prep: NONE SEEN
WBC WET PREP: NONE SEEN
Yeast Wet Prep HPF POC: NONE SEEN

## 2017-05-06 LAB — URINALYSIS, ROUTINE W REFLEX MICROSCOPIC
Bilirubin Urine: NEGATIVE
GLUCOSE, UA: NEGATIVE mg/dL
KETONES UR: NEGATIVE mg/dL
NITRITE: POSITIVE — AB
PH: 6 (ref 5.0–8.0)
PROTEIN: NEGATIVE mg/dL
Specific Gravity, Urine: 1.025 (ref 1.005–1.030)

## 2017-05-06 LAB — PREGNANCY, URINE: Preg Test, Ur: NEGATIVE

## 2017-05-06 MED ORDER — CEPHALEXIN 500 MG PO CAPS: 500.0000 mg | ORAL_CAPSULE | Freq: Three times a day (TID) | ORAL | 0 refills | Status: AC

## 2017-05-06 MED ORDER — ERYTHROMYCIN 5 MG/GM OP OINT: TOPICAL_OINTMENT | OPHTHALMIC | 0 refills | Status: DC

## 2017-05-06 MED ORDER — METRONIDAZOLE 500 MG PO TABS: 500.0000 mg | ORAL_TABLET | Freq: Two times a day (BID) | ORAL | 0 refills | Status: AC

## 2017-05-06 MED FILL — ERYTHROMYCIN EYE OINTMENT: 5 | 15 days supply | Qty: 4 | Fill #0

## 2017-05-06 MED FILL — CEPHALEXIN 500 MG CAPSULE: 500 | 7 days supply | Qty: 21 | Fill #0

## 2017-05-06 MED FILL — metroNIDAZOLE 500 MG TABS: 500 | 7 days supply | Qty: 14 | Fill #0

## 2017-05-06 NOTE — ED Notes (Signed)
ED Provider at bedside. 

## 2017-05-06 NOTE — ED Triage Notes (Signed)
Facial swelling and puffiness around eyes x3 days.  Sts swelling started on the left and switched to the right.  She took Benadryl and Zyrtec with little relief. Also wants to be checked for STD's.  No known exposure, "I just want to be sure everything's OK."

## 2017-05-06 NOTE — ED Provider Notes (Signed)
[facility] EMERGENCY DEPARTMENT Provider Note  CSN: [id number] Arrival date & time: 05/06/17 9983  Chief Complaint(s) multiple complaints  HPI Nicole Frank is a 27 y.o. female who presents with 2 days of eyelid swelling, 1 day of foul-smelling urine and 1 day of vaginal discharge.  HPI   Eye swelling Patient noted left eye swelling yesterday which has now resolved.  Awoke this morning with right eyelid swelling.  Denies any associated pain, visual disturbance.  She denies any recent trauma.  She denies recent URI symptoms.  No fevers or chills.  No associated pain.  Foul-smelling urine Noted yesterday and today.  Denies any dysuria, frequency or urgency.  Denies any suprapubic pain or flank pain.  Vag discharge Believe she has bacterial vaginosis because this is similar to prior episodes.  States that she is sexually active with the same partner for several years.  Denies any pelvic pain.  Denies any vaginal bleeding.  Does not believe that this is a sexually transmitted disease.  Past Medical History Past Medical History:  Diagnosis Date  . Medical history non-contributory   . Preterm labor    There are no active problems to display for this patient.  Home Medication(s) Prior to Admission medications   Medication Sig Start Date End Date Taking? Authorizing Provider  cephALEXin (KEFLEX) 500 MG capsule Take 1 capsule (500 mg total) by mouth 3 (three) times daily for 7 days. 05/06/17 05/13/17  Fatima Blank, MD  erythromycin ophthalmic ointment Place a 1/2 inch ribbon of ointment into the lower eyelid once per day. 05/06/17   Chrystian Ressler, Grayce Sessions, MD  metroNIDAZOLE (FLAGYL) 500 MG tablet Take 1 tablet (500 mg total) by mouth 2 (two) times daily for 7 days. 05/06/17 05/13/17  Fatima Blank, MD                                                                                                                                    Past Surgical History Past  Surgical History:  Procedure Laterality Date  . CESAREAN SECTION     Family History No family history on file.  Social History Social History   Tobacco Use  . Smoking status: Current Some Day Smoker  . Smokeless tobacco: Never Used  Substance Use Topics  . Alcohol use: No  . Drug use: No   Allergies Patient has no known allergies.  Review of Systems Review of Systems All other systems are reviewed and are negative for acute change except as noted in the HPI  Physical Exam Vital Signs  I have reviewed the triage vital signs BP 117/77 (BP Location: Right Arm)   Pulse 72   Temp 98.6 F (37 C) (Oral)   Resp 16   Ht 5\' 6"  (1.676 m)   Wt 68 kg (150 lb)   LMP 04/27/2017   SpO2 98%   BMI 24.21 kg/m   Physical Exam  Constitutional:  She is oriented to person, place, and time. She appears well-developed and well-nourished. No distress.  HENT:  Head: Normocephalic and atraumatic.  Right Ear: External ear normal.  Left Ear: External ear normal.  Nose: Nose normal.  Eyes: Conjunctivae and EOM are normal. Right eye exhibits no chemosis, no discharge and no exudate. No foreign body present in the right eye. Left eye exhibits no chemosis, no discharge and no exudate. No foreign body present in the left eye. No scleral icterus.  Swelling to right upper and lower lid.  Mild hyperemia without overt erythema.  No tenderness to palpation.  No discharge.  Neck: Normal range of motion and phonation normal.  Cardiovascular: Normal rate and regular rhythm.  Pulmonary/Chest: Effort normal. No stridor. No respiratory distress.  Abdominal: She exhibits no distension. There is no tenderness. There is no rigidity, no rebound and no guarding.  Genitourinary:  Genitourinary Comments: Patient declined pelvic exam.  Musculoskeletal: Normal range of motion. She exhibits no edema.  Neurological: She is alert and oriented to person, place, and time.  Skin: She is not diaphoretic.  Psychiatric: She  has a normal mood and affect. Her behavior is normal.  Vitals reviewed.   ED Results and Treatments Labs (all labs ordered are listed, but only abnormal results are displayed) Labs Reviewed  WET PREP, GENITAL - Abnormal; Notable for the following components:      Result Value   Clue Cells Wet Prep HPF POC PRESENT (*)    All other components within normal limits  URINALYSIS, ROUTINE W REFLEX MICROSCOPIC - Abnormal; Notable for the following components:   APPearance CLOUDY (*)    Hgb urine dipstick LARGE (*)    Nitrite POSITIVE (*)    Leukocytes, UA SMALL (*)    All other components within normal limits  URINALYSIS, MICROSCOPIC (REFLEX) - Abnormal; Notable for the following components:   Bacteria, UA MANY (*)    Squamous Epithelial / LPF 0-5 (*)    All other components within normal limits  PREGNANCY, URINE  GC/CHLAMYDIA PROBE AMP ([facility]) NOT AT [facility]                                                                                                                         EKG  EKG Interpretation  Date/Time:    Ventricular Rate:    PR Interval:    QRS Duration:   QT Interval:    QTC Calculation:   R Axis:     Text Interpretation:        Radiology No results found. Pertinent labs & imaging results that were available during my care of the patient were reviewed by me and considered in my medical decision making (see chart for details).  Medications Ordered in ED Medications - No data to display  Procedures Procedures  (including critical care time)  Medical Decision Making / ED Course I have reviewed the nursing notes for this encounter and the patient's prior records (if available in EHR or on provided paperwork).    1.  eye swelling Consistent with blepharitis.  Doubt preseptal or orbital cellulitis.  Recommended supportive management.   Will also provide antibiotic ointment.  2.  Foul smelling urine UA consistent with urinary tract infection.  No evidence suggesting pyelonephritis.  Will treat with Keflex.  3.  Vaginal discharge No suprapubic or pelvic pain concerning for PID.  Blind sweep performed revealed bacterial vaginosis.  No trichomonas.  GC chlamydia sent and pending.  Do not feel that empiric treatment for cervicitis is necessary at this time.  Patient will be treated with Flagyl.  The patient is safe for discharge with strict return precautions.  Final Clinical Impression(s) / ED Diagnoses Final diagnoses:  Squamous blepharitis of both upper and lower eyelid of right eye  Acute cystitis with hematuria  Bacterial vaginosis   Disposition: Discharge  Condition: Good  I have discussed the results, Dx and Tx plan with the patient who expressed understanding and agree(s) with the plan. Discharge instructions discussed at great length. The patient was given strict return precautions who verbalized understanding of the instructions. No further questions at time of discharge.    ED Discharge Orders        Ordered    erythromycin ophthalmic ointment     05/06/17 1039    metroNIDAZOLE (FLAGYL) 500 MG tablet  2 times daily     05/06/17 1039    cephALEXin (KEFLEX) 500 MG capsule  3 times daily     05/06/17 1039       Follow Up: Department, [facility] [address] [place] [address] [phone]  Go to  for routine STD testing  Primary care provider   If you do not have a primary care physician, contact [facility] at [phone] for referral      This chart was dictated using voice recognition software.  Despite best efforts to proofread,  errors can occur which can change the documentation meaning.   Fatima Blank, MD 05/06/17 1128

## 2017-05-07 LAB — GC/CHLAMYDIA PROBE AMP (~~LOC~~) NOT AT ARMC
Chlamydia: NEGATIVE
Neisseria Gonorrhea: NEGATIVE

## 2017-10-23 IMAGING — US US OB COMP LESS 14 WK
1 series · 14 of 14 positions shown · non-contrast
Comparison: None.

CLINICAL DATA: First trimester vaginal bleeding. Estimated
gestational age by last menstrual period equals 8 weeks 6 days

EXAM:
OBSTETRIC <14 WK US AND TRANSVAGINAL OB US
TECHNIQUE: Both transabdominal and transvaginal ultrasound examinations were
performed for complete evaluation of the gestation as well as the
maternal uterus, adnexal regions, and pelvic cul-de-sac.
Transvaginal technique was performed to assess early pregnancy.

[Series 1: us ob comp less 14 wk · 0.10mm/px · 14 acquisitions, 14 frames shown]
[im 1/14]
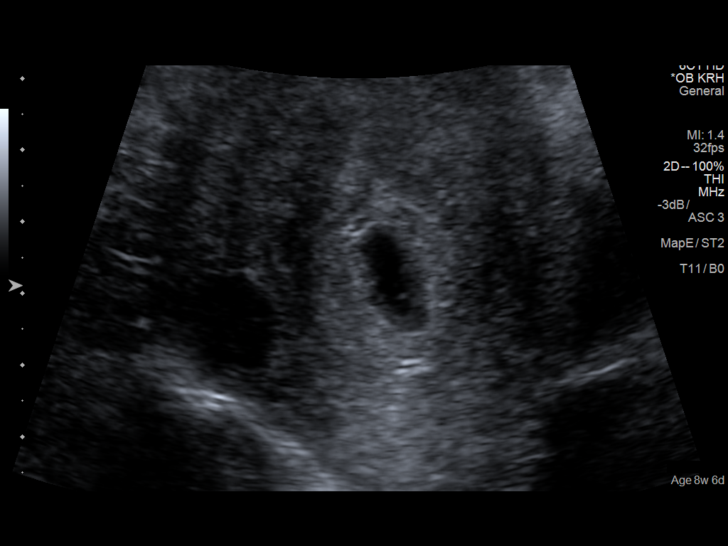
[im 2/14]
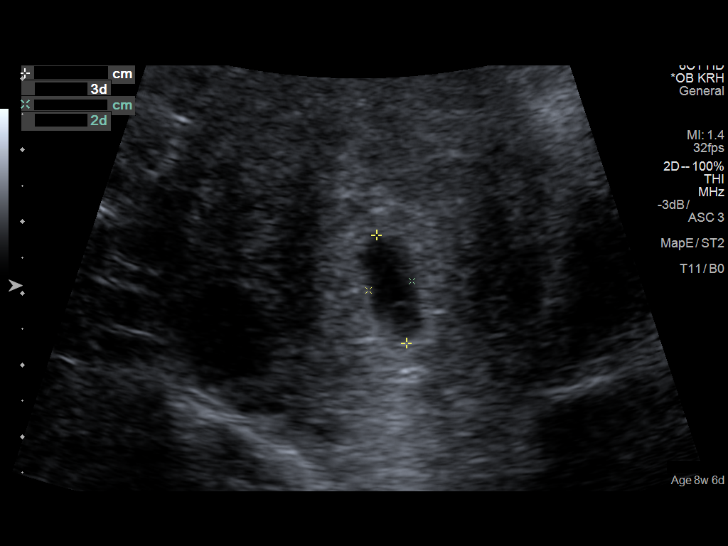
[im 3/14]
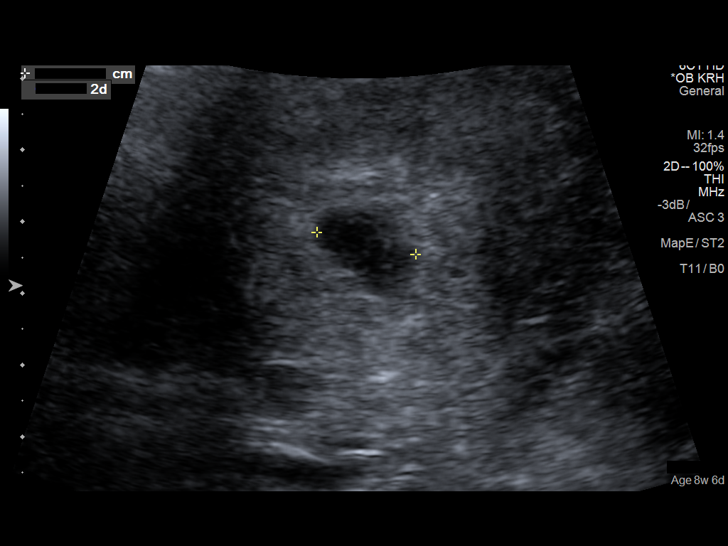
[im 4/14]
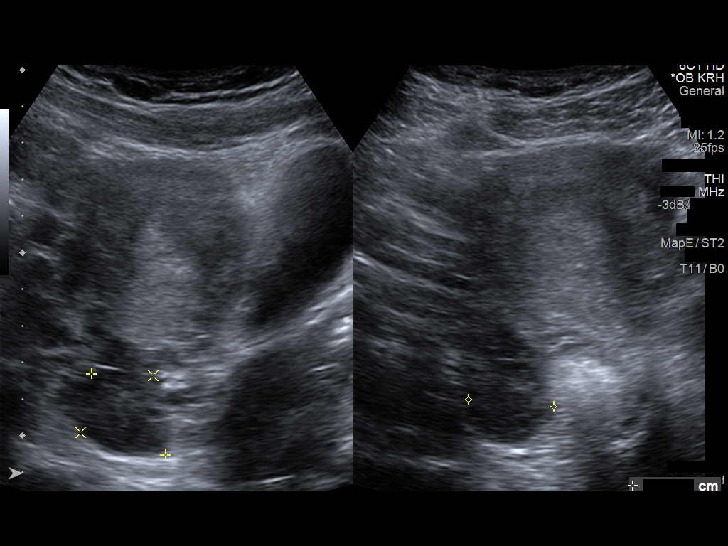
[im 5/14]
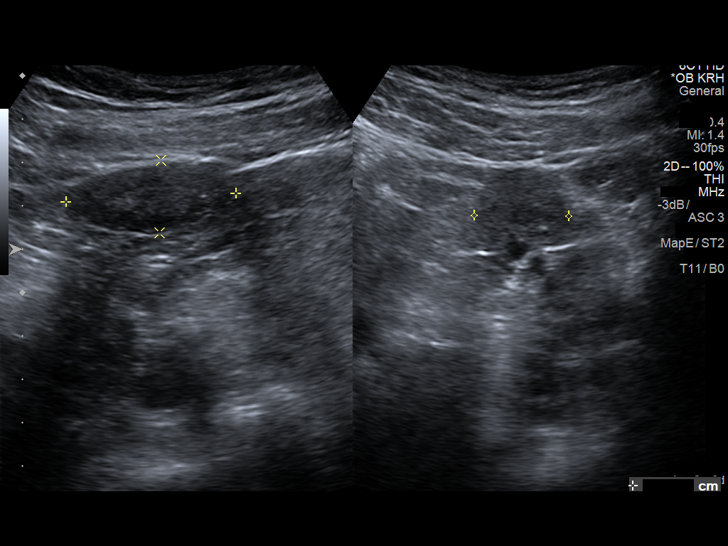
[im 6/14]
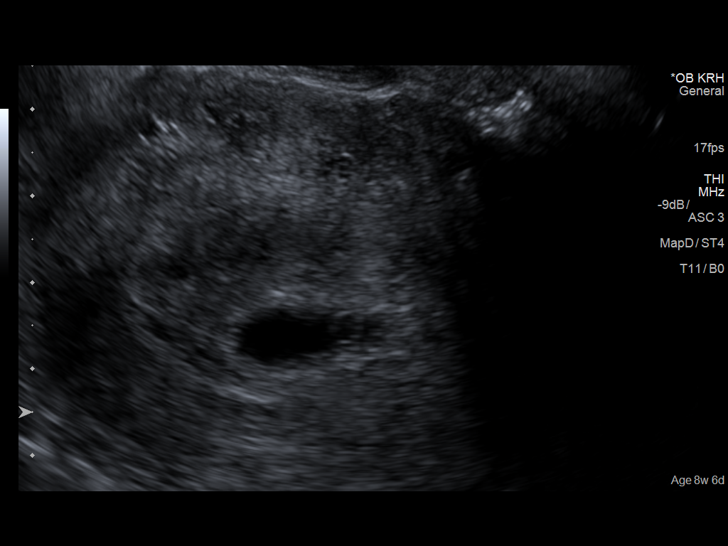
[im 7/14]
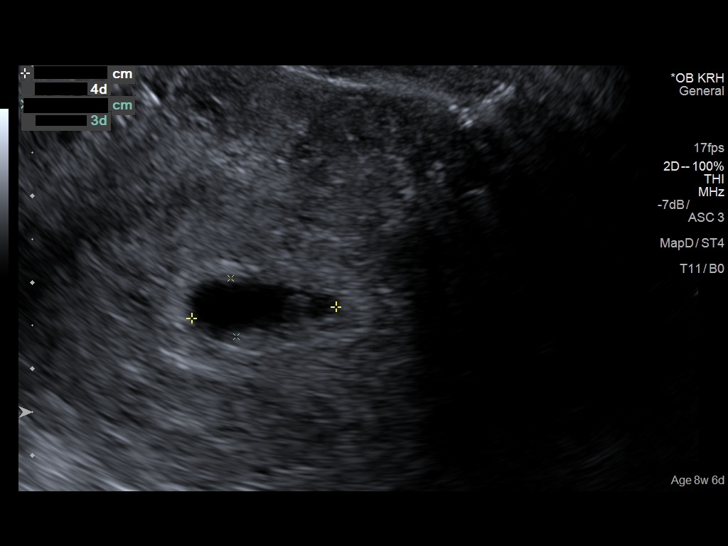
[im 8/14]
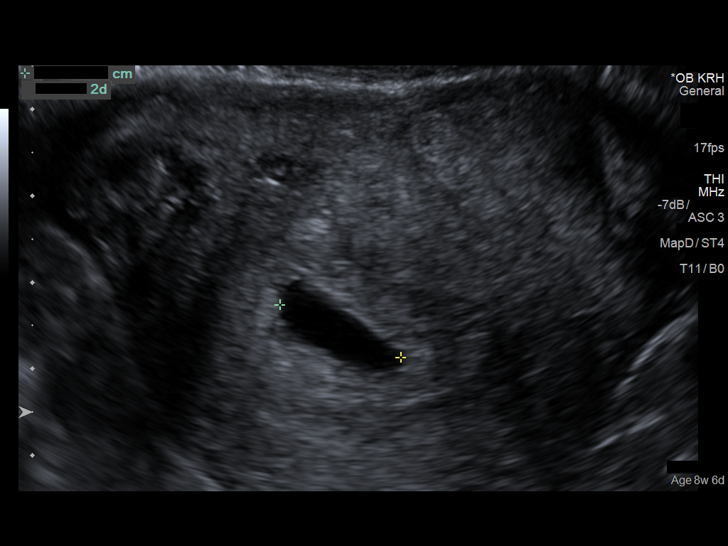
[im 9/14]
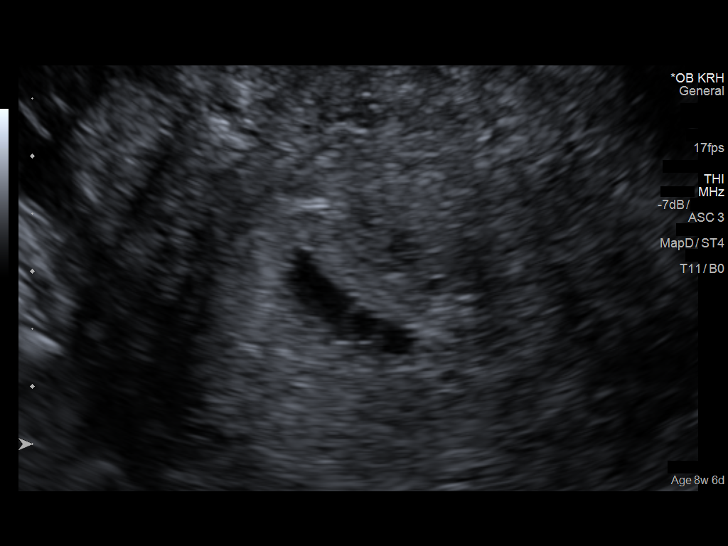
[im 10/14]
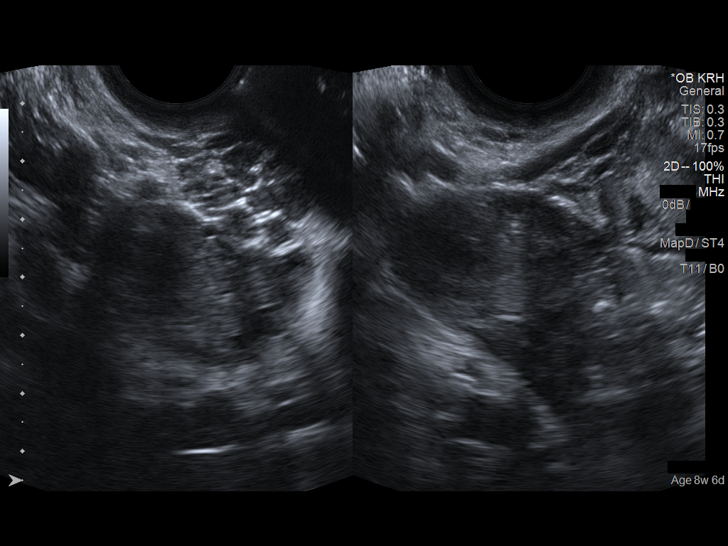
[im 11/14]
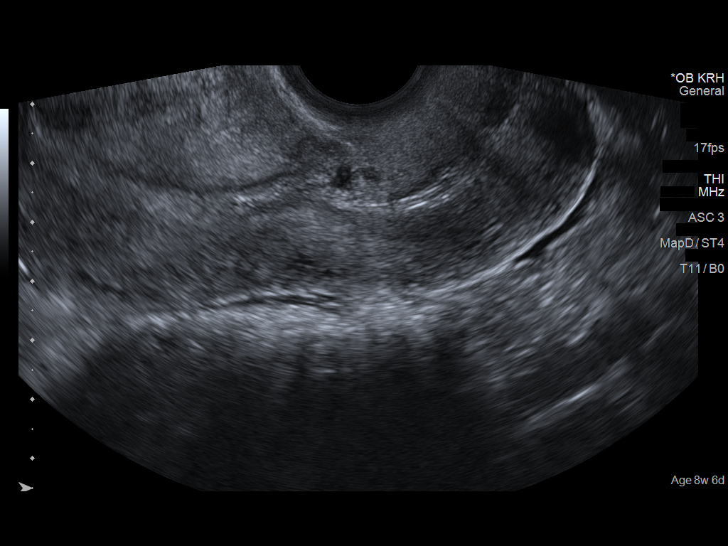
[im 12/14]
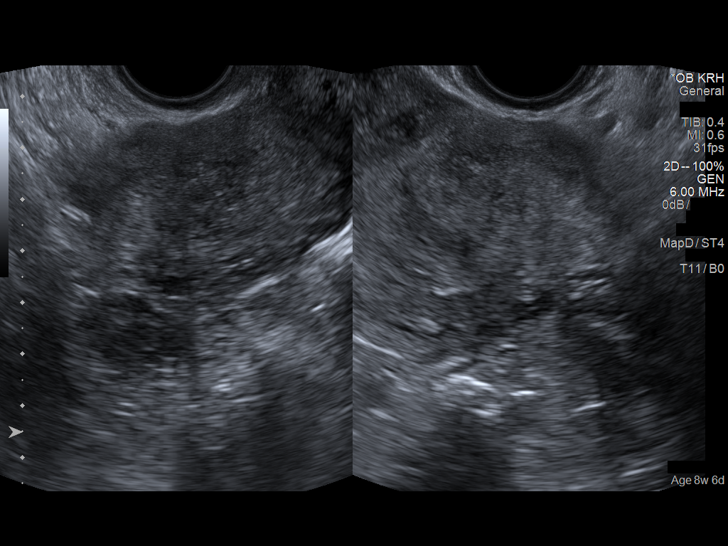
[im 13/14]
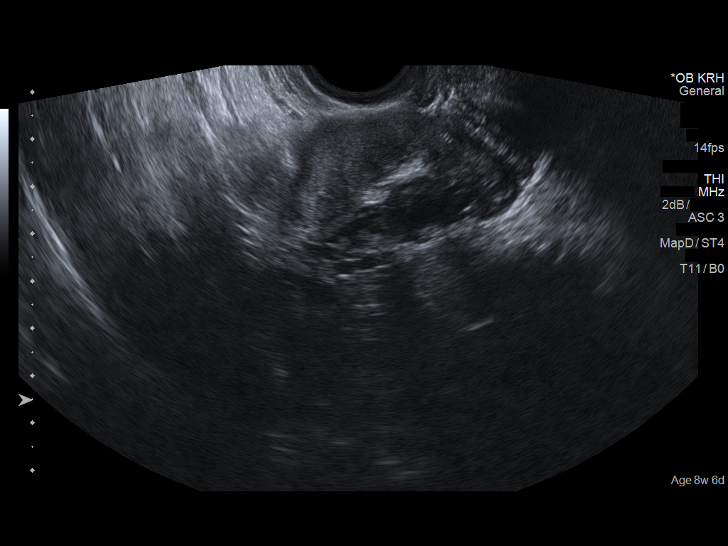
[im 14/14]
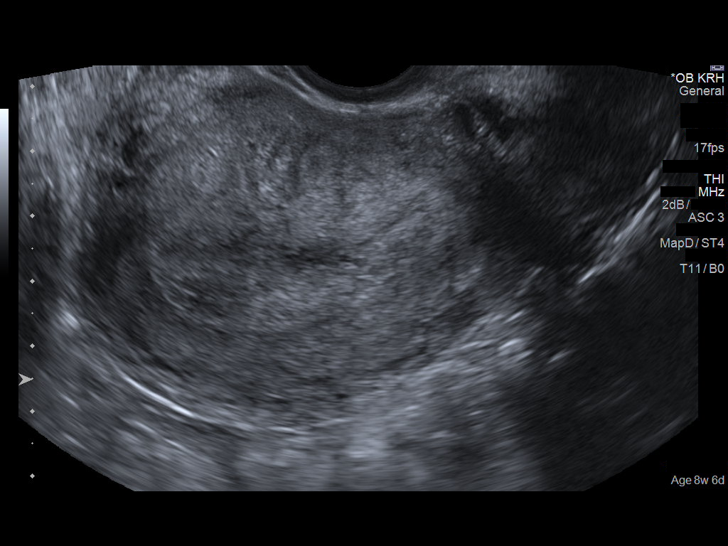

[14 of 14 positions shown; findings below may reference images not displayed]

FINDINGS: Intrauterine gestational sac: Present

Yolk sac:  Present

Embryo:  Not identified

Cardiac Activity: Not identified

MSD:  12   mm   6 w   0 d

Subchorionic hemorrhage:  None visualized.

Maternal uterus/adnexae: Corpus luteal cyst of the RIGHT ovary.
Normal LEFT ovary. No free fluid
IMPRESSION: 1. Intrauterine gestation sac with yolk sac. No fetal pole.
Differential includes early intrauterine gestation versus
spontaneous abortion. Recommend follow-up ultrasound in 10-14 days.
2. Estimated gestational age by mean sac diameter equals 6 weeks 0
days.

## 2018-02-02 ENCOUNTER — Ambulatory Visit (INDEPENDENT_AMBULATORY_CARE_PROVIDER_SITE_OTHER): Payer: 59 | Admitting: Family Medicine

## 2018-02-02 ENCOUNTER — Encounter: Payer: Self-pay | Admitting: Family Medicine

## 2018-02-02 VITALS — BP 119/79 | HR 74 | Ht 66.0 in | Wt 158.0 lb

## 2018-02-02 DIAGNOSIS — Z1151 Encounter for screening for human papillomavirus (HPV): Secondary | ICD-10-CM

## 2018-02-02 DIAGNOSIS — Z124 Encounter for screening for malignant neoplasm of cervix: Secondary | ICD-10-CM | POA: Diagnosis not present

## 2018-02-02 DIAGNOSIS — Z113 Encounter for screening for infections with a predominantly sexual mode of transmission: Secondary | ICD-10-CM | POA: Diagnosis not present

## 2018-02-02 DIAGNOSIS — Z01419 Encounter for gynecological examination (general) (routine) without abnormal findings: Secondary | ICD-10-CM

## 2018-02-02 NOTE — Patient Instructions (Signed)

## 2018-02-02 NOTE — Progress Notes (Signed)
GYNECOLOGY ANNUAL PREVENTATIVE CARE ENCOUNTER NOTE  Subjective:   Nicole Frank is a 27 y.o. [phone] female here for a routine annual gynecologic exam.  Current complaints: none.   Denies abnormal vaginal bleeding, discharge, pelvic pain, problems with intercourse or other gynecologic concerns.    Gynecologic History Patient's last menstrual period was 01/07/2018. Patient is not sexually active  Contraception: none Last Pap: 2018. Results were: ASCUS Last mammogram: n/a.  Obstetric History OB History  Gravida Para Term Preterm AB Living  5       2 2   SAB TAB Ectopic Multiple Live Births  1 1     2     # Outcome Date GA Lbr Len/2nd Weight Sex Delivery Anes PTL Lv  5 Gravida           4 Gravida           3 Gravida           2 TAB           1 SAB             Past Medical History:  Diagnosis Date  . Medical history non-contributory   . Preterm labor   . Vaginal Pap smear, abnormal 2011   colpscopy done    Past Surgical History:  Procedure Laterality Date  . CESAREAN SECTION      Current Outpatient Medications on File Prior to Visit  Medication Sig Dispense Refill  . erythromycin ophthalmic ointment Place a 1/2 inch ribbon of ointment into the lower eyelid once per day. (Patient not taking: Reported on 02/02/2018) 1 g 0   No current facility-administered medications on file prior to visit.     No Known Allergies  Social History   Socioeconomic History  . Marital status: Single    Spouse name: Not on file  . Number of children: Not on file  . Years of education: Not on file  . Highest education level: Not on file  Occupational History  . Not on file  Social Needs  . Financial resource strain: Not on file  . Food insecurity:    Worry: Not on file    Inability: Not on file  . Transportation needs:    Medical: Not on file    Non-medical: Not on file  Tobacco Use  . Smoking status: Former Research scientist (life sciences)  . Smokeless tobacco: Never Used  Substance and Sexual  Activity  . Alcohol use: No  . Drug use: No  . Sexual activity: Yes    Birth control/protection: None  Lifestyle  . Physical activity:    Days per week: Not on file    Minutes per session: Not on file  . Stress: Not on file  Relationships  . Social connections:    Talks on phone: Not on file    Gets together: Not on file    Attends religious service: Not on file    Active member of club or organization: Not on file    Attends meetings of clubs or organizations: Not on file    Relationship status: Not on file  . Intimate partner violence:    Fear of current or ex partner: Not on file    Emotionally abused: Not on file    Physically abused: Not on file    Forced sexual activity: Not on file  Other Topics Concern  . Not on file  Social History Narrative  . Not on file    Family History  Problem Relation  Age of Onset  . Hypertension Maternal Grandfather   . Hypertension Mother   . Stroke Mother   . Cancer Neg Hx   . Diabetes Neg Hx     The following portions of the patient's history were reviewed and updated as appropriate: allergies, current medications, past family history, past medical history, past social history, past surgical history and problem list.  Review of Systems Pertinent items are noted in HPI.   Objective:  BP 119/79   Pulse 74   Ht 5\' 6"  (1.676 m)   Wt 158 lb (71.7 kg)   LMP 01/07/2018   BMI 25.50 kg/m  CONSTITUTIONAL: Well-developed, well-nourished female in no acute distress.  HENT:  Normocephalic, atraumatic, External right and left ear normal. Oropharynx is clear and moist EYES: Conjunctivae and EOM are normal. Pupils are equal, round, and reactive to light. No scleral icterus.  NECK: Normal range of motion, supple, no masses.  Normal thyroid.   CARDIOVASCULAR: Normal heart rate noted, regular rhythm RESPIRATORY: Clear to auscultation bilaterally. Effort and breath sounds normal, no problems with respiration noted. BREASTS: Symmetric in size.  No masses, skin changes, nipple drainage, or lymphadenopathy. ABDOMEN: Soft, normal bowel sounds, no distention noted.  No tenderness, rebound or guarding.  PELVIC: Normal appearing external genitalia; normal appearing vaginal mucosa and cervix.  No abnormal discharge noted.  Normal uterine size, no other palpable masses, no uterine or adnexal tenderness. MUSCULOSKELETAL: Normal range of motion. No tenderness.  No cyanosis, clubbing, or edema.  2+ distal pulses. SKIN: Skin is warm and dry. No rash noted. Not diaphoretic. No erythema. No pallor. NEUROLOGIC: Alert and oriented to person, place, and time. Normal reflexes, muscle tone coordination. No cranial nerve deficit noted. PSYCHIATRIC: Normal mood and affect. Normal behavior. Normal judgment and thought content.  Assessment:  Annual gynecologic examination with pap smear   Plan:  1. Well Woman Exam Will follow up results of pap smear and manage accordingly. STD testing discussed. Patient requested testing, both vaginal and serum testing  Routine preventative health maintenance measures emphasized. Please refer to After Visit Summary for other counseling recommendations.    Loma Boston, [place] for [employer]

## 2018-02-03 LAB — HEPATITIS B SURFACE ANTIGEN: HEP B S AG: NEGATIVE

## 2018-02-03 LAB — HIV ANTIBODY (ROUTINE TESTING W REFLEX): HIV SCREEN 4TH GENERATION: NONREACTIVE

## 2018-02-03 LAB — RPR: RPR Ser Ql: NONREACTIVE

## 2018-02-03 LAB — HEPATITIS C ANTIBODY

## 2018-02-04 LAB — CYTOLOGY - PAP
CHLAMYDIA, DNA PROBE: NEGATIVE
DIAGNOSIS: NEGATIVE
HPV: NOT DETECTED
NEISSERIA GONORRHEA: NEGATIVE

## 2018-04-08 ENCOUNTER — Ambulatory Visit (INDEPENDENT_AMBULATORY_CARE_PROVIDER_SITE_OTHER): Payer: 59

## 2018-04-08 VITALS — BP 108/61 | HR 76 | Ht 66.0 in | Wt 156.0 lb

## 2018-04-08 DIAGNOSIS — N898 Other specified noninflammatory disorders of vagina: Secondary | ICD-10-CM

## 2018-04-08 DIAGNOSIS — Z113 Encounter for screening for infections with a predominantly sexual mode of transmission: Secondary | ICD-10-CM | POA: Diagnosis not present

## 2018-04-08 NOTE — Progress Notes (Addendum)
Pt states that she has been having discharge and odor x 1 day.Pt also requests STD testing.Wet prep was sent to lab. Emmert Roethler l Ivyanna Sibert, CMA   Attestation of Attending Supervision of RN: Evaluation and management procedures were performed by the nurse under my supervision and collaboration.  I have reviewed the nursing note and chart, and I agree with the management and plan.  Carolyn L. Harraway-Smith, M.D., Cherlynn June

## 2018-04-09 LAB — CERVICOVAGINAL ANCILLARY ONLY
Bacterial vaginitis: NEGATIVE
CANDIDA VAGINITIS: NEGATIVE
CHLAMYDIA, DNA PROBE: NEGATIVE
Neisseria Gonorrhea: NEGATIVE
Trichomonas: NEGATIVE

## 2018-06-17 ENCOUNTER — Telehealth: Payer: Self-pay

## 2018-06-17 NOTE — Telephone Encounter (Signed)
Patient called stating that she has been having symptoms of chronic BV.  Patient states last time she was tested (03-2018)she had been using Monistat and she thinks that is why it can up negative.   Patient states she has been using boric acid treatment over the counter from [employer] but has not used anything in the last three days. Patient scheduled for self swab - advised of no visitors in building and that she must wear mask/scarf/covering to enter the building. Patient states understanding. Kathrene Alu RN

## 2018-06-18 ENCOUNTER — Ambulatory Visit (INDEPENDENT_AMBULATORY_CARE_PROVIDER_SITE_OTHER): Payer: 59

## 2018-06-18 ENCOUNTER — Other Ambulatory Visit: Payer: Self-pay

## 2018-06-18 VITALS — BP 107/70 | HR 67 | Ht 66.0 in | Wt 154.0 lb

## 2018-06-18 DIAGNOSIS — N760 Acute vaginitis: Secondary | ICD-10-CM

## 2018-06-18 DIAGNOSIS — B9689 Other specified bacterial agents as the cause of diseases classified elsewhere: Secondary | ICD-10-CM | POA: Diagnosis not present

## 2018-06-18 DIAGNOSIS — N898 Other specified noninflammatory disorders of vagina: Secondary | ICD-10-CM | POA: Diagnosis not present

## 2018-06-18 NOTE — Progress Notes (Addendum)
Pt presents to the office c/o discharge with a fishy odor x 1 month. Pt self swabbed and wet prep was sent to the lab.  Xitlaly Ault l Donyel Castagnola, CMA   Attestation of Attending Supervision of RN: Evaluation and management procedures were performed by the nurse under my supervision and collaboration.  I have reviewed the nursing note and chart, and I agree with the management and plan.  Carolyn L. Harraway-Smith, M.D., Cherlynn June

## 2018-06-19 LAB — CERVICOVAGINAL ANCILLARY ONLY
Bacterial vaginitis: POSITIVE — AB
Candida vaginitis: NEGATIVE

## 2018-06-22 ENCOUNTER — Telehealth: Payer: Self-pay

## 2018-06-22 DIAGNOSIS — B9689 Other specified bacterial agents as the cause of diseases classified elsewhere: Secondary | ICD-10-CM

## 2018-06-22 DIAGNOSIS — N760 Acute vaginitis: Principal | ICD-10-CM

## 2018-06-22 MED ORDER — METRONIDAZOLE 500 MG PO TABS: 500.0000 mg | ORAL_TABLET | Freq: Two times a day (BID) | ORAL | 0 refills | Status: DC

## 2018-06-22 NOTE — Telephone Encounter (Signed)
Called pt with results. Pt made aware of positive BV results. Understanding was voiced.Medication was sent to pharmacy. chiquita l wilson, CMA

## 2018-06-24 ENCOUNTER — Other Ambulatory Visit: Payer: Self-pay | Admitting: Obstetrics & Gynecology

## 2018-09-10 ENCOUNTER — Other Ambulatory Visit: Payer: Self-pay | Admitting: Family Medicine

## 2018-09-10 DIAGNOSIS — N760 Acute vaginitis: Secondary | ICD-10-CM

## 2018-09-10 DIAGNOSIS — B9689 Other specified bacterial agents as the cause of diseases classified elsewhere: Secondary | ICD-10-CM

## 2018-09-10 MED ORDER — METRONIDAZOLE 500 MG PO TABS: 500.0000 mg | ORAL_TABLET | Freq: Two times a day (BID) | ORAL | 0 refills | Status: DC

## 2018-09-21 ENCOUNTER — Ambulatory Visit (INDEPENDENT_AMBULATORY_CARE_PROVIDER_SITE_OTHER): Payer: 59

## 2018-09-21 ENCOUNTER — Other Ambulatory Visit: Payer: Self-pay

## 2018-09-21 VITALS — BP 126/71 | HR 88 | Ht 66.0 in | Wt 150.1 lb

## 2018-09-21 DIAGNOSIS — N898 Other specified noninflammatory disorders of vagina: Secondary | ICD-10-CM | POA: Diagnosis not present

## 2018-09-21 DIAGNOSIS — R399 Unspecified symptoms and signs involving the genitourinary system: Secondary | ICD-10-CM | POA: Diagnosis not present

## 2018-09-21 DIAGNOSIS — Z113 Encounter for screening for infections with a predominantly sexual mode of transmission: Secondary | ICD-10-CM | POA: Diagnosis not present

## 2018-09-21 DIAGNOSIS — R1031 Right lower quadrant pain: Secondary | ICD-10-CM | POA: Diagnosis not present

## 2018-09-21 LAB — POCT URINALYSIS DIPSTICK
Glucose, UA: NEGATIVE
Ketones, UA: NEGATIVE
Leukocytes, UA: NEGATIVE
Nitrite, UA: POSITIVE
Protein, UA: NEGATIVE
Spec Grav, UA: >=1.03 — AB (ref 1.010–1.025)
pH, UA: 5 (ref 5.0–8.0)

## 2018-09-21 NOTE — Progress Notes (Addendum)
Pt states that she has been experiencing pain in abdominal pain x 5 days. Pt also requests to be tested for STI's. Urine culture and self swab sent to the lab. chiquita l wilson, CMA   Attestation of Attending Supervision of CMA/RN: Evaluation and management procedures were performed by the nurse under my supervision and collaboration.  I have reviewed the nursing note and chart, and I agree with the management and plan.  Carolyn L. Harraway-Smith, M.D., Cherlynn June

## 2018-09-23 ENCOUNTER — Other Ambulatory Visit: Payer: Self-pay

## 2018-09-23 DIAGNOSIS — N390 Urinary tract infection, site not specified: Secondary | ICD-10-CM

## 2018-09-23 LAB — URINE CULTURE

## 2018-09-23 MED ORDER — NITROFURANTOIN MONOHYD MACRO 100 MG PO CAPS: 100.0000 mg | ORAL_CAPSULE | Freq: Two times a day (BID) | ORAL | 0 refills | Status: DC

## 2018-09-23 NOTE — Progress Notes (Signed)
Pt called requesting a Rx for UTI symptoms. Pt states that pain is unbearable.  Macrobid 100 mg BID x 5 days was sent to pharmacy.Pt made aware that a different Rx may be sent in if needed once the urine culture has resulted.Understanding was voiced.  Medardo Hassing l Marshun Duva, CMA

## 2018-09-25 LAB — CERVICOVAGINAL ANCILLARY ONLY
Bacterial vaginitis: NEGATIVE
Candida vaginitis: NEGATIVE
Chlamydia: NEGATIVE
Neisseria Gonorrhea: NEGATIVE
Trichomonas: NEGATIVE

## 2018-10-15 ENCOUNTER — Encounter: Payer: Self-pay | Admitting: Family Medicine

## 2018-10-15 ENCOUNTER — Other Ambulatory Visit: Payer: Self-pay

## 2018-10-15 ENCOUNTER — Ambulatory Visit (INDEPENDENT_AMBULATORY_CARE_PROVIDER_SITE_OTHER): Payer: 59 | Admitting: Family Medicine

## 2018-10-15 VITALS — BP 115/67 | HR 67 | Ht 66.0 in | Wt 150.0 lb

## 2018-10-15 DIAGNOSIS — R1031 Right lower quadrant pain: Secondary | ICD-10-CM

## 2018-10-15 DIAGNOSIS — Z113 Encounter for screening for infections with a predominantly sexual mode of transmission: Secondary | ICD-10-CM

## 2018-10-15 LAB — POCT URINALYSIS DIPSTICK
Blood, UA: NEGATIVE
Glucose, UA: NEGATIVE
Ketones, UA: NEGATIVE
Leukocytes, UA: NEGATIVE
Nitrite, UA: NEGATIVE
Protein, UA: NEGATIVE
Spec Grav, UA: 1.02 (ref 1.010–1.025)
pH, UA: 6 (ref 5.0–8.0)

## 2018-10-15 NOTE — Progress Notes (Signed)
   Subjective:    Patient ID: Nicole Frank, female    DOB: [birth year], 28 y.o.   MRN: [id number]  HPI Patient seen for follow-up of right-sided pelvic pain.  Patient has this discomfort approximately every month during menses since she had an abortion in 2018.  Her periods have otherwise been fairly normal.  She started having this pain fairly consistently over the past 3 weeks starting at the beginning of the month.  She had a urine culture and STD testing done, which showed a UTI.  She was treated with Macrobid.  Her urine STI panel was negative.  Other than pelvic pain, the patient does not have any other symptoms such as vaginal discharge.  She is still concerned about STD screen however.   Review of Systems     Objective:   Physical Exam Vitals signs reviewed.  Constitutional:      Appearance: Normal appearance.  Cardiovascular:     Rate and Rhythm: Normal rate and regular rhythm.  Pulmonary:     Effort: Pulmonary effort is normal.     Breath sounds: Normal breath sounds.  Abdominal:     General: Abdomen is flat.     Palpations: Abdomen is soft.     Tenderness: There is abdominal tenderness (right lower pelvic).  Skin:    General: Skin is warm and dry.     Capillary Refill: Capillary refill takes less than 2 seconds.  Neurological:     Mental Status: She is alert.  Psychiatric:        Mood and Affect: Mood normal.        Behavior: Behavior normal.        Thought Content: Thought content normal.       Assessment & Plan:  1. Right lower quadrant abdominal pain Discussed possible [place] - patient declined for the moment. Resend UCx. STI screening off urine. - POCT urinalysis dipstick - Urine Culture - GC/Chlamydia probe amp ([place])not at [facility]

## 2018-10-17 LAB — URINE CULTURE: Organism ID, Bacteria: NO GROWTH

## 2018-10-19 ENCOUNTER — Telehealth: Payer: Self-pay

## 2018-10-19 NOTE — Telephone Encounter (Signed)
Pt called the office requesting results. Pt made aware that urine culture showed no growth. Pt also made aware that GC/Chlamydia test has not resulted. Understanding was voiced.  Srihitha Tagliaferri l Geneviene Tesch, CMA

## 2018-10-20 LAB — GC/CHLAMYDIA PROBE AMP (~~LOC~~) NOT AT ARMC
Chlamydia: NEGATIVE
Neisseria Gonorrhea: NEGATIVE

## 2018-10-21 NOTE — Telephone Encounter (Signed)
Patient called. Patient made aware that GC/chl and urine culture are both negative. Kathrene Alu RN

## 2018-12-08 ENCOUNTER — Telehealth: Payer: Self-pay

## 2018-12-08 DIAGNOSIS — B9689 Other specified bacterial agents as the cause of diseases classified elsewhere: Secondary | ICD-10-CM

## 2018-12-08 MED ORDER — METRONIDAZOLE 500 MG PO TABS: 500.0000 mg | ORAL_TABLET | Freq: Two times a day (BID) | ORAL | 0 refills | Status: DC

## 2018-12-08 NOTE — Telephone Encounter (Signed)
Pt called the office stating she has been having discharge with odor x 1 week. Pt requests a Rx for Flagyl. Pt states that her previous Dr. Prescribed Boric Acid for recurrent BV. Pt made aware that medication will be sent to pharmacy. Pt advised to call the office to schedule an appt if she develops BV again after being treated this time. Understanding was voiced.   chiquita l wilson, CMA

## 2019-01-08 ENCOUNTER — Encounter (HOSPITAL_COMMUNITY): Payer: Self-pay | Admitting: *Deleted

## 2019-01-08 ENCOUNTER — Inpatient Hospital Stay (HOSPITAL_COMMUNITY): Payer: Medicaid Other

## 2019-01-08 ENCOUNTER — Inpatient Hospital Stay (HOSPITAL_COMMUNITY)
Admission: AD | Admit: 2019-01-08 | Discharge: 2019-01-08 | Disposition: A | Discharge status: Home / Self Care | Payer: Medicaid Other | Attending: Obstetrics & Gynecology | Admitting: Obstetrics & Gynecology

## 2019-01-08 ENCOUNTER — Other Ambulatory Visit: Payer: Self-pay

## 2019-01-08 DIAGNOSIS — O23591 Infection of other part of genital tract in pregnancy, first trimester: Secondary | ICD-10-CM | POA: Insufficient documentation

## 2019-01-08 DIAGNOSIS — O34219 Maternal care for unspecified type scar from previous cesarean delivery: Secondary | ICD-10-CM | POA: Insufficient documentation

## 2019-01-08 DIAGNOSIS — B9689 Other specified bacterial agents as the cause of diseases classified elsewhere: Secondary | ICD-10-CM

## 2019-01-08 DIAGNOSIS — O26891 Other specified pregnancy related conditions, first trimester: Secondary | ICD-10-CM

## 2019-01-08 DIAGNOSIS — Z792 Long term (current) use of antibiotics: Secondary | ICD-10-CM | POA: Insufficient documentation

## 2019-01-08 DIAGNOSIS — D259 Leiomyoma of uterus, unspecified: Secondary | ICD-10-CM | POA: Diagnosis present

## 2019-01-08 DIAGNOSIS — O99891 Other specified diseases and conditions complicating pregnancy: Secondary | ICD-10-CM | POA: Insufficient documentation

## 2019-01-08 DIAGNOSIS — Z3A01 Less than 8 weeks gestation of pregnancy: Secondary | ICD-10-CM

## 2019-01-08 DIAGNOSIS — O26899 Other specified pregnancy related conditions, unspecified trimester: Secondary | ICD-10-CM

## 2019-01-08 DIAGNOSIS — Z8751 Personal history of pre-term labor: Secondary | ICD-10-CM | POA: Insufficient documentation

## 2019-01-08 DIAGNOSIS — R109 Unspecified abdominal pain: Secondary | ICD-10-CM | POA: Insufficient documentation

## 2019-01-08 DIAGNOSIS — O3411 Maternal care for benign tumor of corpus uteri, first trimester: Secondary | ICD-10-CM | POA: Insufficient documentation

## 2019-01-08 DIAGNOSIS — Z8249 Family history of ischemic heart disease and other diseases of the circulatory system: Secondary | ICD-10-CM | POA: Insufficient documentation

## 2019-01-08 DIAGNOSIS — O368310 Maternal care for abnormalities of the fetal heart rate or rhythm, first trimester, not applicable or unspecified: Secondary | ICD-10-CM | POA: Insufficient documentation

## 2019-01-08 DIAGNOSIS — Z87891 Personal history of nicotine dependence: Secondary | ICD-10-CM | POA: Insufficient documentation

## 2019-01-08 DIAGNOSIS — Z3A09 9 weeks gestation of pregnancy: Secondary | ICD-10-CM | POA: Insufficient documentation

## 2019-01-08 DIAGNOSIS — O3680X0 Pregnancy with inconclusive fetal viability, not applicable or unspecified: Secondary | ICD-10-CM

## 2019-01-08 LAB — URINALYSIS, ROUTINE W REFLEX MICROSCOPIC
Bilirubin Urine: NEGATIVE
Glucose, UA: NEGATIVE mg/dL
Hgb urine dipstick: NEGATIVE
Ketones, ur: NEGATIVE mg/dL
Nitrite: NEGATIVE
Protein, ur: NEGATIVE mg/dL
Specific Gravity, Urine: 1.025 (ref 1.005–1.030)
pH: 5 (ref 5.0–8.0)

## 2019-01-08 LAB — CBC
HCT: 38.1 % (ref 36.0–46.0)
Hemoglobin: 12.5 g/dL (ref 12.0–15.0)
MCH: 29.2 pg (ref 26.0–34.0)
MCHC: 32.8 g/dL (ref 30.0–36.0)
MCV: 89 fL (ref 80.0–100.0)
Platelets: 294 10*3/uL (ref 150–400)
RBC: 4.28 MIL/uL (ref 3.87–5.11)
RDW: 12.8 % (ref 11.5–15.5)
WBC: 7.6 10*3/uL (ref 4.0–10.5)
nRBC: 0 % (ref 0.0–0.2)

## 2019-01-08 LAB — HCG, QUANTITATIVE, PREGNANCY: hCG, Beta Chain, Quant, S: 4051 m[IU]/mL — ABNORMAL HIGH (ref <5)

## 2019-01-08 LAB — WET PREP, GENITAL
Sperm: NONE SEEN
Trich, Wet Prep: NONE SEEN
Yeast Wet Prep HPF POC: NONE SEEN

## 2019-01-08 LAB — POCT PREGNANCY, URINE: Preg Test, Ur: POSITIVE — AB

## 2019-01-08 MED ORDER — ACETAMINOPHEN 500 MG PO TABS
1000.0000 mg | ORAL_TABLET | Freq: Once | ORAL | Status: AC
  Administered 2019-01-08: 1000 mg via ORAL
  Filled 2019-01-08: qty 2

## 2019-01-08 MED ORDER — METRONIDAZOLE 0.75 % VA GEL: 1.0000 | Freq: Every day | VAGINAL | 0 refills | Status: DC

## 2019-01-08 NOTE — MAU Provider Note (Addendum)
History     CSN: [id number]  Arrival date and time: 01/08/19 [id number]   First Provider Initiated Contact with Patient 01/08/19 2111      Chief Complaint  Patient presents with  . Abdominal Pain   Nicole Frank is a 28 y.o. [id number] at [redacted]w[redacted]d by Definite LMP of Sept 13, 2020.  However, she states she put a birth control patch on in early October, but did not have a period once she took it off.  She presents today for Abdominal Pain.  She states she had some spotting on Wednesday with wiping that was bright pink and was a few times, but did not saturate the tissue.  Patient reports that she also started having the abdominal pain on Wednesday that she describes as an "aching cramping."  She also states that she was experiencing the symptoms in her lower back.  She reports that the pain is intermittent in nature.  She denies recent sexual activity and reports vaginal discharge that she suspects is BV.      OB History    Gravida  7   Para  2   Term  1   Preterm  1   AB  4   Living  2     SAB  1   TAB  3   Ectopic      Multiple      Live Births  2           Past Medical History:  Diagnosis Date  . Medical history non-contributory   . Preterm labor   . Vaginal Pap smear, abnormal 2011   colpscopy done    Past Surgical History:  Procedure Laterality Date  . CESAREAN SECTION      Family History  Problem Relation Age of Onset  . Hypertension Maternal Grandfather   . Hypertension Mother   . Stroke Mother   . Cancer Neg Hx   . Diabetes Neg Hx     Social History   Tobacco Use  . Smoking status: Former Research scientist (life sciences)  . Smokeless tobacco: Never Used  Substance Use Topics  . Alcohol use: No  . Drug use: No    Allergies: No Known Allergies  Medications Prior to Admission  Medication Sig Dispense Refill Last Dose  . Prenatal Vit-Fe Fumarate-FA (PRENATAL MULTIVITAMIN) TABS tablet Take 1 tablet by mouth daily at 12 noon.     . metroNIDAZOLE (FLAGYL) 500 MG tablet  Take 1 tablet (500 mg total) by mouth 2 (two) times daily. (Patient not taking: Reported on 09/21/2018) 14 tablet 0   . metroNIDAZOLE (FLAGYL) 500 MG tablet Take 1 tablet (500 mg total) by mouth 2 (two) times daily. 14 tablet 0   . nitrofurantoin, macrocrystal-monohydrate, (MACROBID) 100 MG capsule Take 1 capsule (100 mg total) by mouth 2 (two) times daily. Take one tab PO BID x 5 days (Patient not taking: Reported on 10/15/2018) 14 capsule 0     Review of Systems  Constitutional: Negative for chills and fever.  Respiratory: Negative for cough and shortness of breath.   Gastrointestinal: Positive for abdominal pain (7/10). Negative for constipation, diarrhea, nausea and vomiting.  Genitourinary: Positive for vaginal discharge ("It's clear, but I feel like its a bacterial infection."). Negative for difficulty urinating, dysuria, pelvic pain and vaginal bleeding.   Physical Exam   Blood pressure 115/80, pulse 88, temperature 99 F (37.2 C), resp. rate 18, height 5\' 6"  (1.676 m), weight 68.9 kg, last menstrual period 11/01/2018, unknown if currently breastfeeding.  Physical Exam  Constitutional: She is oriented to person, place, and time. She appears well-developed and well-nourished. No distress.  HENT:  Head: Normocephalic and atraumatic.  Eyes: Conjunctivae are normal.  Neck: Normal range of motion.  Cardiovascular: Normal rate.  Respiratory: Effort normal.  GI: Soft. There is abdominal tenderness.  Genitourinary: Uterus is enlarged. Uterus is not tender. Cervix exhibits no motion tenderness.    Vaginal discharge present.     No vaginal bleeding.  No bleeding in the vagina.    Genitourinary Comments: Speculum Exam: -Normal External Genitalia: Non tender, no apparent discharge at introitus.  -Vaginal Vault: Pink mucosa with good rugae. Moderate amt thin white discharge -wet prep collected -Cervix:Pink, no lesions, or polyps. Small nabothian cyst noted at 6'o'clock. Cervix appears closed.  Active bleeding from os noted after collection of GC/CT    Musculoskeletal: Normal range of motion.        General: No edema.  Neurological: She is alert and oriented to person, place, and time.  Skin: Skin is warm and dry.  Psychiatric: She has a normal mood and affect. Her behavior is normal.    MAU Course  Procedures Results for orders placed or performed during the hospital encounter of 01/08/19 (from the past 24 hour(s))  Urinalysis, Routine w reflex microscopic     Status: Abnormal   Collection Time: 01/08/19  7:55 PM  Result Value Ref Range   Color, Urine YELLOW YELLOW   APPearance CLOUDY (A) CLEAR   Specific Gravity, Urine 1.025 1.005 - 1.030   pH 5.0 5.0 - 8.0   Glucose, UA NEGATIVE NEGATIVE mg/dL   Hgb urine dipstick NEGATIVE NEGATIVE   Bilirubin Urine NEGATIVE NEGATIVE   Ketones, ur NEGATIVE NEGATIVE mg/dL   Protein, ur NEGATIVE NEGATIVE mg/dL   Nitrite NEGATIVE NEGATIVE   Leukocytes,Ua SMALL (A) NEGATIVE   RBC / HPF 6-10 0 - 5 RBC/hpf   WBC, UA 6-10 0 - 5 WBC/hpf   Bacteria, UA MANY (A) NONE SEEN   Squamous Epithelial / LPF 21-50 0 - 5   Mucus PRESENT   Pregnancy, urine POC     Status: Abnormal   Collection Time: 01/08/19  7:57 PM  Result Value Ref Range   Preg Test, Ur POSITIVE (A) NEGATIVE  Wet prep, genital     Status: Abnormal   Collection Time: 01/08/19  9:20 PM  Result Value Ref Range   Yeast Wet Prep HPF POC NONE SEEN NONE SEEN   Trich, Wet Prep NONE SEEN NONE SEEN   Clue Cells Wet Prep HPF POC PRESENT (A) NONE SEEN   WBC, Wet Prep HPF POC MODERATE (A) NONE SEEN   Sperm NONE SEEN   CBC     Status: None   Collection Time: 01/08/19  9:32 PM  Result Value Ref Range   WBC 7.6 4.0 - 10.5 K/uL   RBC 4.28 3.87 - 5.11 MIL/uL   Hemoglobin 12.5 12.0 - 15.0 g/dL   HCT 38.1 36.0 - 46.0 %   MCV 89.0 80.0 - 100.0 fL   MCH 29.2 26.0 - 34.0 pg   MCHC 32.8 30.0 - 36.0 g/dL   RDW 12.8 11.5 - 15.5 %   Platelets 294 150 - 400 K/uL   nRBC 0.0 0.0 - 0.2 %   US  Ob Comp Less 14 Wks  Result Date: 01/08/2019 CLINICAL DATA:  Abdominal pain for 2 days EXAM: OBSTETRIC <14 WK [place] AND TRANSVAGINAL OB US TECHNIQUE: Both transabdominal and transvaginal ultrasound examinations were performed for  complete evaluation of the gestation as well as the maternal uterus, adnexal regions, and pelvic cul-de-sac. Transvaginal technique was performed to assess early pregnancy. COMPARISON:  None. FINDINGS: Intrauterine gestational sac: Single Yolk sac:  Visualized. Embryo:  Visualized. Cardiac Activity: Visualized. Heart Rate: 66 bpm CRL: 2.99 mm   5 w   5 d                  [place] EDC: 09/05/2019 Subchorionic hemorrhage:  None visualized. Maternal uterus/adnexae: Ovaries are within normal limits. The right ovary measures 2.7 x 3 x 2.6 cm. The left ovary measures 2.2 x 2.8 x 1.8 cm. Right lower uterine segment mass measuring 2 x 1.6 x 1.8 cm consistent with fibroid. Right posterior fundal mass measuring 2.4 x 2.9 x 2.6 cm. IMPRESSION: 1. Single viable intrauterine pregnancy as above. Fetal cardiac activity of 66 BPM consistent with bradycardia 2. Uterine fibroids Electronically Signed   By: Donavan Foil M.D.   On: 01/08/2019 22:30   US Ob Transvaginal  Result Date: 01/08/2019 CLINICAL DATA:  Abdominal pain for 2 days EXAM: OBSTETRIC <14 WK [place] AND TRANSVAGINAL OB US TECHNIQUE: Both transabdominal and transvaginal ultrasound examinations were performed for complete evaluation of the gestation as well as the maternal uterus, adnexal regions, and pelvic cul-de-sac. Transvaginal technique was performed to assess early pregnancy. COMPARISON:  None. FINDINGS: Intrauterine gestational sac: Single Yolk sac:  Visualized. Embryo:  Visualized. Cardiac Activity: Visualized. Heart Rate: 66 bpm CRL: 2.99 mm   5 w   5 d                  [place] EDC: 09/05/2019 Subchorionic hemorrhage:  None visualized. Maternal uterus/adnexae: Ovaries are within normal limits. The right ovary measures 2.7 x 3 x 2.6 cm. The left  ovary measures 2.2 x 2.8 x 1.8 cm. Right lower uterine segment mass measuring 2 x 1.6 x 1.8 cm consistent with fibroid. Right posterior fundal mass measuring 2.4 x 2.9 x 2.6 cm. IMPRESSION: 1. Single viable intrauterine pregnancy as above. Fetal cardiac activity of 66 BPM consistent with bradycardia 2. Uterine fibroids Electronically Signed   By: Donavan Foil M.D.   On: 01/08/2019 22:30    MDM Pelvic Exam; Wet Prep and GC/CT Labs: UA, UPT, CBC, hCG Ultrasound Pain Medication Assessment and Plan  28 year old  [id number] at 9.5 weeks by LMP Abdominal Pain  -Reviewed POC -Patient initially refused pain medication, but then agreeable once told it would be tylenol.  -Discussed concern for ectopic pregnancy vs miscarriage. -Informed of need for [place] to try to confirm EDD in midst of birth control usage.  -Exam performed and findings reviewed. -Cultures collected and pending.  -Labs ordered and collected. -Will give tylenol and send for [place]. -Will await results and then reassess.  Maryann Conners MSN, CNM 01/08/2019, 9:11 PM   Reassessment (10:40 PM) SIUP at 5.5 weeks Fetal Bradycardia Bacterial Vaginosis Uterine Fibroids  -Wet prep returns significant for clue cells -In room to discuss results. -Informed of need for repeat US in 7-10 days to assess fetal heart rate/viability. -Instructed that if [place] returns normal can call and start care at [facility]. -Order for [place] placed.  -Informed uterine fibroids seen on ultrasound. Discussed risk factors and informed that monitoring of them would occur throughout pregnancy as appropriate.  -Informed of bacterial infection.  Discussed treatment with oral flagyl.  Patient reports nausea with this dosing. Rx for Metrogel sent.  -Reviewed bleeding precautions. -Patient without further questions or concerns. -  Encouraged to call or return to MAU if symptoms worsen or with the onset of new symptoms. -Discharged to home in stable condition.  Maryann Conners  MSN, CNM Advanced Practice Provider, [facility] for [employer]

## 2019-01-08 NOTE — MAU Note (Signed)
Pt stated she has been having sharp pain in her lower abd since Wed.has some spotting also.

## 2019-01-08 NOTE — Discharge Instructions (Signed)
Uterine Fibroids  Uterine fibroids (leiomyomas) are noncancerous (benign) tumors that can develop in the uterus. Fibroids may also develop in the fallopian tubes, cervix, or tissues (ligaments) near the uterus. You may have one or many fibroids. Fibroids vary in size, weight, and where they grow in the uterus. Some can become quite large. Most fibroids do not require medical treatment. What are the causes? The cause of this condition is not known. What increases the risk? You are more likely to develop this condition if you:  Are in your 30s or 40s and have not gone through menopause.  Have a family history of this condition.  Are of African-American descent.  Had your first period at an early age (early menarche).  Have not had any children (nulliparity).  Are overweight or obese. What are the signs or symptoms? Many women do not have any symptoms. Symptoms of this condition may include:  Heavy menstrual bleeding.  Bleeding or spotting between periods.  Pain and pressure in the pelvic area, between the hips.  Bladder problems, such as needing to urinate urgently or more often than usual.  Inability to have children (infertility).  Failure to carry pregnancy to term (miscarriage). How is this diagnosed? This condition may be diagnosed based on:  Your symptoms and medical history.  A physical exam.  A pelvic exam that includes feeling for any tumors.  Imaging tests, such as ultrasound or MRI. How is this treated? Treatment for this condition may include:  Seeing your health care provider for follow-up visits to monitor your fibroids for any changes.  Taking NSAIDs such as ibuprofen, naproxen, or aspirin to reduce pain.  Hormone medicines. These may be taken as a pill, given in an injection, or delivered by a T-shaped device that is inserted into the uterus (intrauterine device, IUD).  Surgery to remove one of the following: ? The fibroids (myomectomy). Your health  care provider may recommend this if fibroids affect your fertility and you want to become pregnant. ? The uterus (hysterectomy). ? Blood supply to the fibroids (uterine artery embolization). Follow these instructions at home:  Take over-the-counter and prescription medicines only as told by your health care provider.  Ask your health care provider if you should take iron pills or eat more iron-rich foods, such as dark green, leafy vegetables. Heavy menstrual bleeding can cause low iron levels.  If directed, apply heat to your back or abdomen to reduce pain. Use the heat source that your health care provider recommends, such as a moist heat pack or a heating pad. ? Place a towel between your skin and the heat source. ? Leave the heat on for 20-30 minutes. ? Remove the heat if your skin turns bright red. This is especially important if you are unable to feel pain, heat, or cold. You may have a greater risk of getting burned.  Pay close attention to your menstrual cycle. Tell your health care provider about any changes, such as: ? Increased blood flow that requires you to use more pads or tampons than usual. ? A change in the number of days that your period lasts. ? A change in symptoms that are associated with your period, such as back pain or cramps in your abdomen.  Keep all follow-up visits as told by your health care provider. This is important, especially if your fibroids need to be monitored for any changes. Contact a health care provider if you:  Have pelvic pain, back pain, or cramps in your abdomen that  do not get better with medicine or heat.  Develop new bleeding between periods.  Have increased bleeding during or between periods.  Feel unusually tired or weak.  Feel light-headed. Get help right away if you:  Faint.  Have pelvic pain that suddenly gets worse.  Have severe vaginal bleeding that soaks a tampon or pad in 30 minutes or less. Summary  Uterine fibroids are  noncancerous (benign) tumors that can develop in the uterus.  The exact cause of this condition is not known.  Most fibroids do not require medical treatment unless they affect your ability to have children (fertility).  Contact a health care provider if you have pelvic pain, back pain, or cramps in your abdomen that do not get better with medicines.  Make sure you know what symptoms should cause you to get help right away. This information is not intended to replace advice given to you by your health care provider. Make sure you discuss any questions you have with your health care provider. Document Released: 02/02/2000 Document Revised: 01/17/2017 Document Reviewed: 12/31/2016 Elsevier Patient Education  2020 [place]. Bacterial Vaginosis  Bacterial vaginosis is a vaginal infection that occurs when the normal balance of bacteria in the vagina is disrupted. It results from an overgrowth of certain bacteria. This is the most common vaginal infection among women ages 48-44. Because bacterial vaginosis increases your risk for STIs (sexually transmitted infections), getting treated can help reduce your risk for chlamydia, gonorrhea, herpes, and HIV (human immunodeficiency virus). Treatment is also important for preventing complications in pregnant women, because this condition can cause an early (premature) delivery. What are the causes? This condition is caused by an increase in harmful bacteria that are normally present in small amounts in the vagina. However, the reason that the condition develops is not fully understood. What increases the risk? The following factors may make you more likely to develop this condition:  Having a new sexual partner or multiple sexual partners.  Having unprotected sex.  Douching.  Having an intrauterine device (IUD).  Smoking.  Drug and alcohol abuse.  Taking certain antibiotic medicines.  Being pregnant. You cannot get bacterial vaginosis from  toilet seats, bedding, swimming pools, or contact with objects around you. What are the signs or symptoms? Symptoms of this condition include:  Grey or white vaginal discharge. The discharge can also be watery or foamy.  A fish-like odor with discharge, especially after sexual intercourse or during menstruation.  Itching in and around the vagina.  Burning or pain with urination. Some women with bacterial vaginosis have no signs or symptoms. How is this diagnosed? This condition is diagnosed based on:  Your medical history.  A physical exam of the vagina.  Testing a sample of vaginal fluid under a microscope to look for a large amount of bad bacteria or abnormal cells. Your health care provider may use a cotton swab or a small wooden spatula to collect the sample. How is this treated? This condition is treated with antibiotics. These may be given as a pill, a vaginal cream, or a medicine that is put into the vagina (suppository). If the condition comes back after treatment, a second round of antibiotics may be needed. Follow these instructions at home: Medicines  Take over-the-counter and prescription medicines only as told by your health care provider.  Take or use your antibiotic as told by your health care provider. Do not stop taking or using the antibiotic even if you start to feel better. General instructions  If you have a female sexual partner, tell her that you have a vaginal infection. She should see her health care provider and be treated if she has symptoms. If you have a female sexual partner, he does not need treatment.  During treatment: ? Avoid sexual activity until you finish treatment. ? Do not douche. ? Avoid alcohol as directed by your health care provider. ? Avoid breastfeeding as directed by your health care provider.  Drink enough water and fluids to keep your urine clear or pale yellow.  Keep the area around your vagina and rectum clean. ? Wash the area  daily with warm water. ? Wipe yourself from front to back after using the toilet.  Keep all follow-up visits as told by your health care provider. This is important. How is this prevented?  Do not douche.  Wash the outside of your vagina with warm water only.  Use protection when having sex. This includes latex condoms and dental dams.  Limit how many sexual partners you have. To help prevent bacterial vaginosis, it is best to have sex with just one partner (monogamous).  Make sure you and your sexual partner are tested for STIs.  Wear cotton or cotton-lined underwear.  Avoid wearing tight pants and pantyhose, especially during summer.  Limit the amount of alcohol that you drink.  Do not use any products that contain nicotine or tobacco, such as cigarettes and e-cigarettes. If you need help quitting, ask your health care provider.  Do not use illegal drugs. Where to find more information  Centers for Disease Control and Prevention: AppraiserFraud.fi  American Sexual Health Association (ASHA): www.ashastd.org  U.S. Department of Health and Financial controller, Office on Women's Health: DustingSprays.pl or SecuritiesCard.it Contact a health care provider if:  Your symptoms do not improve, even after treatment.  You have more discharge or pain when urinating.  You have a fever.  You have pain in your abdomen.  You have pain during sex.  You have vaginal bleeding between periods. Summary  Bacterial vaginosis is a vaginal infection that occurs when the normal balance of bacteria in the vagina is disrupted.  Because bacterial vaginosis increases your risk for STIs (sexually transmitted infections), getting treated can help reduce your risk for chlamydia, gonorrhea, herpes, and HIV (human immunodeficiency virus). Treatment is also important for preventing complications in pregnant women, because the condition can cause an early  (premature) delivery.  This condition is treated with antibiotic medicines. These may be given as a pill, a vaginal cream, or a medicine that is put into the vagina (suppository). This information is not intended to replace advice given to you by your health care provider. Make sure you discuss any questions you have with your health care provider. Document Released: 02/04/2005 Document Revised: 01/17/2017 Document Reviewed: 10/21/2015 Elsevier Patient Education  2020 [employer].

## 2019-01-10 ENCOUNTER — Emergency Department (HOSPITAL_BASED_OUTPATIENT_CLINIC_OR_DEPARTMENT_OTHER)
Admission: EM | Admit: 2019-01-10 | Discharge: 2019-01-10 | Disposition: A | Discharge status: Home / Self Care | Payer: Medicaid Other | Attending: Emergency Medicine | Admitting: Emergency Medicine

## 2019-01-10 ENCOUNTER — Encounter (HOSPITAL_COMMUNITY): Payer: Self-pay

## 2019-01-10 ENCOUNTER — Inpatient Hospital Stay (HOSPITAL_COMMUNITY)
Admission: AD | Admit: 2019-01-10 | Discharge: 2019-01-10 | Disposition: A | Discharge status: Home / Self Care | Payer: Medicaid Other | Attending: Family Medicine | Admitting: Family Medicine

## 2019-01-10 ENCOUNTER — Encounter (HOSPITAL_BASED_OUTPATIENT_CLINIC_OR_DEPARTMENT_OTHER): Payer: Self-pay | Admitting: Emergency Medicine

## 2019-01-10 ENCOUNTER — Other Ambulatory Visit: Payer: Self-pay

## 2019-01-10 DIAGNOSIS — Z3A01 Less than 8 weeks gestation of pregnancy: Secondary | ICD-10-CM | POA: Insufficient documentation

## 2019-01-10 DIAGNOSIS — Z792 Long term (current) use of antibiotics: Secondary | ICD-10-CM | POA: Insufficient documentation

## 2019-01-10 DIAGNOSIS — Z5321 Procedure and treatment not carried out due to patient leaving prior to being seen by health care provider: Secondary | ICD-10-CM | POA: Insufficient documentation

## 2019-01-10 DIAGNOSIS — O3680X0 Pregnancy with inconclusive fetal viability, not applicable or unspecified: Secondary | ICD-10-CM | POA: Insufficient documentation

## 2019-01-10 DIAGNOSIS — O34219 Maternal care for unspecified type scar from previous cesarean delivery: Secondary | ICD-10-CM | POA: Insufficient documentation

## 2019-01-10 DIAGNOSIS — Z87891 Personal history of nicotine dependence: Secondary | ICD-10-CM | POA: Insufficient documentation

## 2019-01-10 DIAGNOSIS — Z8751 Personal history of pre-term labor: Secondary | ICD-10-CM | POA: Insufficient documentation

## 2019-01-10 DIAGNOSIS — O209 Hemorrhage in early pregnancy, unspecified: Secondary | ICD-10-CM | POA: Insufficient documentation

## 2019-01-10 LAB — CBC
HCT: 38.8 % (ref 36.0–46.0)
Hemoglobin: 12.8 g/dL (ref 12.0–15.0)
MCH: 29.3 pg (ref 26.0–34.0)
MCHC: 33 g/dL (ref 30.0–36.0)
MCV: 88.8 fL (ref 80.0–100.0)
Platelets: 296 10*3/uL (ref 150–400)
RBC: 4.37 MIL/uL (ref 3.87–5.11)
RDW: 12.5 % (ref 11.5–15.5)
WBC: 7.3 10*3/uL (ref 4.0–10.5)
nRBC: 0 % (ref 0.0–0.2)

## 2019-01-10 LAB — URINALYSIS, ROUTINE W REFLEX MICROSCOPIC
Bilirubin Urine: NEGATIVE
Glucose, UA: NEGATIVE mg/dL
Ketones, ur: 5 mg/dL — AB
Leukocytes,Ua: NEGATIVE
Nitrite: NEGATIVE
Protein, ur: 30 mg/dL — AB
Specific Gravity, Urine: 1.021 (ref 1.005–1.030)
pH: 6 (ref 5.0–8.0)

## 2019-01-10 LAB — HCG, QUANTITATIVE, PREGNANCY: hCG, Beta Chain, Quant, S: 3305 m[IU]/mL — ABNORMAL HIGH (ref <5)

## 2019-01-10 NOTE — MAU Provider Note (Signed)
History     CSN: [id number]  Arrival date and time: 01/10/19 1410   First Provider Initiated Contact with Patient 01/10/19 1447      Chief Complaint  Patient presents with  . Vaginal Bleeding   Nicole Frank is a 28 y.o. G7P2 at [redacted]w[redacted]d by LMP who presents to MAU with complaints of vaginal bleeding. She was seen in MAU on 11/20 for same complaints. Reports vaginal bleeding got brighter in color this morning. When she was seen a couple of days ago reports light pink spotting. She now reports bleeding as bright red vaginal spotting- having to wear a panty liner for it. Showed panty liner and 3cm vaginal bleeding noted- denies passing clots or having to change panty liner throughout the day. She denies abdominal pain, vaginal discharge or urinary symptoms.    OB History    Gravida  7   Para  2   Term  1   Preterm  1   AB  4   Living  2     SAB  1   TAB  3   Ectopic      Multiple      Live Births  2           Past Medical History:  Diagnosis Date  . Medical history non-contributory   . Preterm labor   . Vaginal Pap smear, abnormal 2011   colpscopy done    Past Surgical History:  Procedure Laterality Date  . CESAREAN SECTION      Family History  Problem Relation Age of Onset  . Hypertension Maternal Grandfather   . Hypertension Mother   . Stroke Mother   . Cancer Neg Hx   . Diabetes Neg Hx     Social History   Tobacco Use  . Smoking status: Former Research scientist (life sciences)  . Smokeless tobacco: Never Used  Substance Use Topics  . Alcohol use: No  . Drug use: No    Allergies: No Known Allergies  Medications Prior to Admission  Medication Sig Dispense Refill Last Dose  . metroNIDAZOLE (METROGEL VAGINAL) 0.75 % vaginal gel Place 1 Applicatorful vaginally at bedtime. Insert one applicator, at bedtime, for 5 nights. 70 g 0   . Prenatal Vit-Fe Fumarate-FA (PRENATAL MULTIVITAMIN) TABS tablet Take 1 tablet by mouth daily at 12 noon.       Review of Systems   Constitutional: Negative.   Respiratory: Negative.   Cardiovascular: Negative.   Gastrointestinal: Negative.   Genitourinary: Positive for vaginal bleeding. Negative for difficulty urinating, dysuria, frequency, pelvic pain and urgency.  Musculoskeletal: Negative.   Neurological: Negative.    Physical Exam   Blood pressure 119/70, pulse 85, temperature 98.7 F (37.1 C), temperature source Oral, resp. rate 16, height 5\' 6"  (1.676 m), weight 68.3 kg, last menstrual period 11/01/2018, SpO2 100 %, unknown if currently breastfeeding.  Physical Exam  Nursing note and vitals reviewed. Constitutional: She is oriented to person, place, and time. She appears well-developed and well-nourished.  Cardiovascular: Normal rate and regular rhythm.  Respiratory: Effort normal and breath sounds normal. No respiratory distress. She has no wheezes.  GI: Soft. She exhibits no distension. There is no abdominal tenderness. There is no rebound and no guarding.  Genitourinary:    Genitourinary Comments: Patient declines pelvic examination - reports "she believes pelvic examination stirred up everything". Examined panty liner- small 3cm spotting noted on liner   Musculoskeletal: Normal range of motion.        General: No edema.  Neurological: She is alert and oriented to person, place, and time.  Psychiatric: She has a normal mood and affect. Her behavior is normal. Thought content normal.    MAU Course  Procedures  MDM Orders Placed This Encounter  Procedures  . Urinalysis, Routine w reflex microscopic  . CBC  . hCG, quantitative, pregnancy   Lab results reviewed:  Results for orders placed or performed during the hospital encounter of 01/10/19 (from the past 24 hour(s))  Urinalysis, Routine w reflex microscopic     Status: Abnormal   Collection Time: 01/10/19  2:33 PM  Result Value Ref Range   Color, Urine YELLOW YELLOW   APPearance CLOUDY (A) CLEAR   Specific Gravity, Urine 1.021 1.005 - 1.030    pH 6.0 5.0 - 8.0   Glucose, UA NEGATIVE NEGATIVE mg/dL   Hgb urine dipstick LARGE (A) NEGATIVE   Bilirubin Urine NEGATIVE NEGATIVE   Ketones, ur 5 (A) NEGATIVE mg/dL   Protein, ur 30 (A) NEGATIVE mg/dL   Nitrite NEGATIVE NEGATIVE   Leukocytes,Ua NEGATIVE NEGATIVE   RBC / HPF 0-5 0 - 5 RBC/hpf   WBC, UA 0-5 0 - 5 WBC/hpf   Bacteria, UA RARE (A) NONE SEEN   Squamous Epithelial / LPF 21-50 0 - 5   Mucus PRESENT   CBC     Status: None   Collection Time: 01/10/19  3:06 PM  Result Value Ref Range   WBC 7.3 4.0 - 10.5 K/uL   RBC 4.37 3.87 - 5.11 MIL/uL   Hemoglobin 12.8 12.0 - 15.0 g/dL   HCT 38.8 36.0 - 46.0 %   MCV 88.8 80.0 - 100.0 fL   MCH 29.3 26.0 - 34.0 pg   MCHC 33.0 30.0 - 36.0 g/dL   RDW 12.5 11.5 - 15.5 %   Platelets 296 150 - 400 K/uL   nRBC 0.0 0.0 - 0.2 %  hCG, quantitative, pregnancy     Status: Abnormal   Collection Time: 01/10/19  3:06 PM  Result Value Ref Range   hCG, Beta Chain, Quant, S 3,305 (H) <5 mIU/mL   HCG on 11/20 4,051 today it has dropped slightly to 3,305  Discussed results of labs with patient. Discussed plan of serial HCG in 48 hours - if labs continue to drop then will diagnose with miscarriage and cancel follow up US, patient verbalizes understanding and agrees with plan of care. Patient unable to come to office for f/u HCG d/t work schedule - plans to return to MAU for labs.   Miscarriage precautions reviewed with patient. Discussed reasons to return to MAU. Return to MAU on Tuesday for repeat HCG- plans to come after work. Pt stable at time of discharge.   Assessment and Plan   1. Pregnancy with uncertain fetal viability, single or unspecified fetus    Discharge home Follow up in MAU on Tuesday for repeat HCG- orders placed under sign and held  Return to MAU as needed for reasons discussed and/or emergencies  Miscarriage precautions   Follow-up Information    [facility]. Go on 01/12/2019.   Specialty: Obstetrics  and Gynecology Why: Return to MAU on Tuesday for repeat HCG  Contact information: [id number] [place] [phone]          Allergies as of 01/10/2019   No Known Allergies     Medication List    TAKE these medications   metroNIDAZOLE 0.75 % vaginal gel Commonly known as: METROGEL VAGINAL  Place 1 Applicatorful vaginally at bedtime. Insert one applicator, at bedtime, for 5 nights.   prenatal multivitamin Tabs tablet Take 1 tablet by mouth daily at 12 noon.       Lajean Manes CNM 01/10/2019, 5:15 PM

## 2019-01-10 NOTE — MAU Note (Signed)
Nicole Frank is a 28 y.o. at [redacted]w[redacted]d here in MAU reporting: saw some bright red bleeding starting this morning. Was here Friday night and saw pink bleeding and it had stopped. Today she is wearing a panty liner, has not had to change it at all today. No pain.  Onset of complaint: got worse today  Pain score: 0/10  Vitals:   01/10/19 1426  BP: 119/70  Pulse: 85  Resp: 16  Temp: 98.7 F (37.1 C)  SpO2: 100%     Lab orders placed from triage: UA

## 2019-01-10 NOTE — ED Triage Notes (Signed)
She is [redacted] weeks pregnant. Vaginal bleeding this morning. Not at heavy as her menstrual. Denies pain

## 2019-01-11 LAB — GC/CHLAMYDIA PROBE AMP (~~LOC~~) NOT AT ARMC
Chlamydia: NEGATIVE
Comment: NEGATIVE
Comment: NORMAL
Neisseria Gonorrhea: NEGATIVE

## 2019-01-13 ENCOUNTER — Encounter (HOSPITAL_COMMUNITY): Payer: Self-pay

## 2019-01-13 ENCOUNTER — Inpatient Hospital Stay (HOSPITAL_COMMUNITY): Payer: Self-pay

## 2019-01-13 ENCOUNTER — Inpatient Hospital Stay (HOSPITAL_COMMUNITY)
Admission: AD | Admit: 2019-01-13 | Discharge: 2019-01-13 | Disposition: A | Discharge status: Home / Self Care | Payer: Self-pay | Attending: Obstetrics and Gynecology | Admitting: Obstetrics and Gynecology

## 2019-01-13 ENCOUNTER — Other Ambulatory Visit: Payer: Self-pay

## 2019-01-13 DIAGNOSIS — O039 Complete or unspecified spontaneous abortion without complication: Secondary | ICD-10-CM | POA: Insufficient documentation

## 2019-01-13 DIAGNOSIS — O209 Hemorrhage in early pregnancy, unspecified: Secondary | ICD-10-CM

## 2019-01-13 DIAGNOSIS — Z3A01 Less than 8 weeks gestation of pregnancy: Secondary | ICD-10-CM

## 2019-01-13 NOTE — Discharge Instructions (Signed)
Return to care   If you have heavier bleeding that soaks through more that 2 pads per hour for an hour or more  If you bleed so much that you feel like you might pass out or you do pass out  If you have significant abdominal pain that is not improved with Tylenol   If you develop a fever > 100.5     Miscarriage A miscarriage is the loss of an unborn baby (fetus) before the 20th week of pregnancy. Most miscarriages happen during the first 3 months of pregnancy. Sometimes, a miscarriage can happen before a woman knows that she is pregnant. Having a miscarriage can be an emotional experience. If you have had a miscarriage, talk with your health care provider about any questions you may have about miscarrying, the grieving process, and your plans for future pregnancy. What are the causes? A miscarriage may be caused by:  Problems with the genes or chromosomes of the fetus. These problems make it impossible for the baby to develop normally. They are often the result of random errors that occur early in the development of the baby, and are not passed from parent to child (not inherited).  Infection of the cervix or uterus.  Conditions that affect hormone balance in the body.  Problems with the cervix, such as the cervix opening and thinning before pregnancy is at term (cervical insufficiency).  Problems with the uterus. These may include: ? A uterus with an abnormal shape. ? Fibroids in the uterus. ? Congenital abnormalities. These are problems that were present at birth.  Certain medical conditions.  Smoking, drinking alcohol, or using drugs.  Injury (trauma). In many cases, the cause of a miscarriage is not known. What are the signs or symptoms? Symptoms of this condition include:  Vaginal bleeding or spotting, with or without cramps or pain.  Pain or cramping in the abdomen or lower back.  Passing fluid, tissue, or blood clots from the vagina. How is this diagnosed? This  condition may be diagnosed based on:  A physical exam.  Ultrasound.  Blood tests.  Urine tests. How is this treated? Treatment for a miscarriage is sometimes not necessary if you naturally pass all the tissue that was in your uterus. If necessary, this condition may be treated with:  Dilation and curettage (D&C). This is a procedure in which the cervix is stretched open and the lining of the uterus (endometrium) is scraped. This is done only if tissue from the fetus or placenta remains in the body (incomplete miscarriage).  Medicines, such as: ? Antibiotic medicine, to treat infection. ? Medicine to help the body pass any remaining tissue. ? Medicine to reduce (contract) the size of the uterus. These medicines may be given if you have a lot of bleeding. If you have Rh negative blood and your baby was Rh positive, you will need a shot of a medicine called Rh immunoglobulinto protect your future babies from Rh blood problems. "Rh-negative" and "Rh-positive" refer to whether or not the blood has a specific protein found on the surface of red blood cells (Rh factor). Follow these instructions at home: Medicines   Take over-the-counter and prescription medicines only as told by your health care provider.  If you were prescribed antibiotic medicine, take it as told by your health care provider. Do not stop taking the antibiotic even if you start to feel better.  Do not take NSAIDs, such as aspirin and ibuprofen, unless they are approved by your health care  provider. These medicines can cause bleeding. Activity  Rest as directed. Ask your health care provider what activities are safe for you.  Have someone help with home and family responsibilities during this time. General instructions  Keep track of the number of sanitary pads you use each day and how soaked (saturated) they are. Write down this information.  Monitor the amount of tissue or blood clots that you pass from your vagina.  Save any large amounts of tissue for your health care provider to examine.  Do not use tampons, douche, or have sex until your health care provider approves.  To help you and your partner with the process of grieving, talk with your health care provider or seek counseling.  When you are ready, meet with your health care provider to discuss any important steps you should take for your health. Also, discuss steps you should take to have a healthy pregnancy in the future.  Keep all follow-up visits as told by your health care provider. This is important. Where to find more information  The American Congress of Obstetricians and Gynecologists: www.acog.org  U.S. Department of Health and Programmer, systems of Womens Health: [place]BeachSigns.tn Contact a health care provider if:  You have a fever or chills.  You have a foul smelling vaginal discharge.  You have more bleeding instead of less. Get help right away if:  You have severe cramps or pain in your back or abdomen.  You pass blood clots or tissue from your vagina that is walnut-sized or larger.  You soak more than 1 regular sanitary pad in an hour.  You become light-headed or weak.  You pass out.  You have feelings of sadness that take over your thoughts, or you have thoughts of hurting yourself. Summary  Most miscarriages happen in the first 3 months of pregnancy. Sometimes miscarriage happens before a woman even knows that she is pregnant.  Follow your health care provider's instruction for home care. Keep all follow-up appointments.  To help you and your partner with the process of grieving, talk with your health care provider or seek counseling. This information is not intended to replace advice given to you by your health care provider. Make sure you discuss any questions you have with your health care provider. Document Released: 07/31/2000 Document Revised: 05/29/2018 Document Reviewed: 03/12/2016 Elsevier  Patient Education  2020 [employer].

## 2019-01-13 NOTE — MAU Provider Note (Signed)
Chief Complaint: Vaginal Bleeding   First Provider Initiated Contact with Patient 01/13/19 0859     SUBJECTIVE HPI: Nicole Frank is a 28 y.o. [id number] at [redacted]w[redacted]d who presents to Maternity Admissions reporting vaginal bleeding. This is her second MAU visit in the last week. On 11/20, ultrasound showed a live IUP with bradycardia (FHR 66). Pt returned to MAU 11/22 & labs showed a slight drop in HCG. Per that MAU note, patient was to return yesterday for a repeat HCG.  Pt states she had heavy bleeding on Saturday & Sunday that has decreased since then. Is not saturating pads but has been passing small blood clots when she uses the bathroom. Denies abdominal pain or fever/chills.    Past Medical History:  Diagnosis Date  . Preterm labor   . Vaginal Pap smear, abnormal 2011   colpscopy done   OB History  Gravida Para Term Preterm AB Living  7 2 1 1 4 2   SAB TAB Ectopic Multiple Live Births  1 3     2     # Outcome Date GA Lbr Len/2nd Weight Sex Delivery Anes PTL Lv  7 Current           6 Preterm 06/24/13 [redacted]w[redacted]d   M CS-LTranv   LIV  5 Term 12/29/09 [redacted]w[redacted]d   F Vag-Breech   LIV  4 TAB           3 TAB           2 SAB           1 TAB            Past Surgical History:  Procedure Laterality Date  . CESAREAN SECTION     Social History   Socioeconomic History  . Marital status: Single    Spouse name: Not on file  . Number of children: Not on file  . Years of education: Not on file  . Highest education level: Not on file  Occupational History  . Not on file  Social Needs  . Financial resource strain: Not on file  . Food insecurity    Worry: Not on file    Inability: Not on file  . Transportation needs    Medical: Not on file    Non-medical: Not on file  Tobacco Use  . Smoking status: Former Research scientist (life sciences)  . Smokeless tobacco: Never Used  Substance and Sexual Activity  . Alcohol use: No  . Drug use: No  . Sexual activity: Yes    Birth control/protection: None  Lifestyle  . Physical  activity    Days per week: Not on file    Minutes per session: Not on file  . Stress: Not on file  Relationships  . Social Herbalist on phone: Not on file    Gets together: Not on file    Attends religious service: Not on file    Active member of club or organization: Not on file    Attends meetings of clubs or organizations: Not on file    Relationship status: Not on file  . Intimate partner violence    Fear of current or ex partner: Not on file    Emotionally abused: Not on file    Physically abused: Not on file    Forced sexual activity: Not on file  Other Topics Concern  . Not on file  Social History Narrative  . Not on file   Family History  Problem Relation Age of Onset  .  Hypertension Maternal Grandfather   . Hypertension Mother   . Stroke Mother   . Cancer Neg Hx   . Diabetes Neg Hx    No current facility-administered medications on file prior to encounter.    Current Outpatient Medications on File Prior to Encounter  Medication Sig Dispense Refill  . metroNIDAZOLE (METROGEL VAGINAL) 0.75 % vaginal gel Place 1 Applicatorful vaginally at bedtime. Insert one applicator, at bedtime, for 5 nights. 70 g 0  . Prenatal Vit-Fe Fumarate-FA (PRENATAL MULTIVITAMIN) TABS tablet Take 1 tablet by mouth daily at 12 noon.     No Known Allergies  I have reviewed patient's Past Medical Hx, Surgical Hx, Family Hx, Social Hx, medications and allergies.   Review of Systems  Constitutional: Negative.   Gastrointestinal: Negative.   Genitourinary: Positive for vaginal bleeding.    OBJECTIVE Patient Vitals for the past 24 hrs:  BP Temp Temp src Pulse Resp SpO2 Weight  01/13/19 0846 110/63 98.3 F (36.8 C) Oral 83 16 100 % -  01/13/19 0841 - - - - - - 68.2 kg   Constitutional: Well-developed, well-nourished female in no acute distress.  Cardiovascular: normal rate & rhythm, no murmur Respiratory: normal rate and effort. Lung sounds clear throughout GI: Abd soft,  non-tender, Pos BS x 4. No guarding or rebound tenderness MS: Extremities nontender, no edema, normal ROM Neurologic: Alert and oriented x 4.      LAB RESULTS No results found for this or any previous visit (from the past 24 hour(s)).  IMAGING US Ob Transvaginal  Result Date: 01/13/2019 CLINICAL DATA:  Vaginal bleeding EXAM: TRANSVAGINAL OB ULTRASOUND TECHNIQUE: Transvaginal ultrasound was performed for complete evaluation of the gestation as well as the maternal uterus, adnexal regions, and pelvic cul-de-sac. COMPARISON:  January 08, 2019 FINDINGS: Gestational sac is no longer identified. Maternal uterus/adnexae: Two stable uterine fibroids are again identified. Right ovary measures 4.1 x 2.8 x 2.3 cm. Left ovary measures 3.4 x 2.9 x 2.2 cm. There is no free fluid. IMPRESSION: Gestational sac no longer identified. Electronically Signed   By: Macy Mis M.D.   On: 01/13/2019 10:49    MAU COURSE Orders Placed This Encounter  Procedures  . US OB Transvaginal  . Discharge patient   No orders of the defined types were placed in this encounter.   MDM VSS & pt afebrile RH positive  Ultrasound today shows IUGS no longer present, confirming completed miscarriage. Pt with minimal bleeding at this time.   ASSESSMENT 1. Miscarriage   2. Vaginal bleeding in pregnancy, first trimester     PLAN Discharge home in stable condition. Bleeding & infection precautions Msg to [facility] to schedule SAB f/u in 2 weeks & to cancel her new ob appt  Follow-up Information    [facility] Follow up.   Specialty: Obstetrics and Gynecology Why: return for worsening symptoms Contact information: [address] [id number] [facility] [place] Follow up.   Specialty: Obstetrics and Gynecology Why: the office will call you to reschedule your next appointment Contact  information: [address] [place] [phone]         Allergies as of 01/13/2019   No Known Allergies     Medication List    TAKE these medications   metroNIDAZOLE 0.75 % vaginal gel Commonly known as: METROGEL VAGINAL Place 1 Applicatorful vaginally at bedtime. Insert  one applicator, at bedtime, for 5 nights.   prenatal multivitamin Tabs tablet Take 1 tablet by mouth daily at 12 noon.        Jorje Guild, NP 01/13/2019  11:30 AM

## 2019-01-13 NOTE — MAU Note (Signed)
Ongoing bleeding and cramping.  Pain not as bad.  Bleeding off and on, few clots.

## 2019-01-19 ENCOUNTER — Encounter: Payer: 59 | Admitting: Women's Health

## 2019-01-28 ENCOUNTER — Ambulatory Visit (INDEPENDENT_AMBULATORY_CARE_PROVIDER_SITE_OTHER): Payer: Medicaid Other | Admitting: Obstetrics & Gynecology

## 2019-01-28 ENCOUNTER — Other Ambulatory Visit: Payer: Self-pay

## 2019-01-28 ENCOUNTER — Encounter: Payer: Self-pay | Admitting: Obstetrics & Gynecology

## 2019-01-28 VITALS — BP 110/69 | HR 72 | Ht 66.0 in | Wt 151.1 lb

## 2019-01-28 DIAGNOSIS — Z3009 Encounter for other general counseling and advice on contraception: Secondary | ICD-10-CM | POA: Diagnosis not present

## 2019-01-28 DIAGNOSIS — O039 Complete or unspecified spontaneous abortion without complication: Secondary | ICD-10-CM

## 2019-01-28 DIAGNOSIS — D251 Intramural leiomyoma of uterus: Secondary | ICD-10-CM

## 2019-01-28 DIAGNOSIS — D252 Subserosal leiomyoma of uterus: Secondary | ICD-10-CM

## 2019-01-28 MED ORDER — NORELGESTROMIN-ETH ESTRADIOL 150-35 MCG/24HR TD PTWK: 1.0000 | MEDICATED_PATCH | TRANSDERMAL | 12 refills | Status: DC

## 2019-01-28 NOTE — Patient Instructions (Signed)

## 2019-01-28 NOTE — Progress Notes (Signed)
History:  28 y.o. [id number] here today for f/u of SAB 01/13/2019. Pt reports that she is feeling emotionally stable. She is feeling some pain on her right lower side. She reports that she has felt this monthly for some time. She denies bleeding since her MAU visit.     The following portions of the patient's history were reviewed and updated as appropriate: allergies, current medications, past family history, past medical history, past social history, past surgical history and problem list.  Review of Systems:  Pertinent items are noted in HPI.    Objective:  Physical Exam Blood pressure 110/69, pulse 72, height 5\' 6"  (1.676 m), weight 151 lb 1.3 oz (68.5 kg), last menstrual period 11/01/2018, unknown if currently breastfeeding.  CONSTITUTIONAL: Well-developed, well-nourished female in no acute distress.  HENT:  Normocephalic, atraumatic EYES: Conjunctivae and EOM are normal. No scleral icterus.  NECK: Normal range of motion SKIN: Skin is warm and dry. No rash noted. Not diaphoretic.No pallor. [place]: Alert and oriented to person, place, and time. Normal coordination.  Abd: Soft, nontender and nondistended; fibroid palpated on the right lower fundus. This is tender.   Pelvic: deferred  Labs and Imaging US OB Comp Less 14 Wks  Result Date: 01/08/2019 CLINICAL DATA:  Abdominal pain for 2 days EXAM: OBSTETRIC <14 WK [place] AND TRANSVAGINAL OB US TECHNIQUE: Both transabdominal and transvaginal ultrasound examinations were performed for complete evaluation of the gestation as well as the maternal uterus, adnexal regions, and pelvic cul-de-sac. Transvaginal technique was performed to assess early pregnancy. COMPARISON:  None. FINDINGS: Intrauterine gestational sac: Single Yolk sac:  Visualized. Embryo:  Visualized. Cardiac Activity: Visualized. Heart Rate: 66 bpm CRL: 2.99 mm   5 w   5 d                  [place] EDC: 09/05/2019 Subchorionic hemorrhage:  None visualized. Maternal uterus/adnexae: Ovaries  are within normal limits. The right ovary measures 2.7 x 3 x 2.6 cm. The left ovary measures 2.2 x 2.8 x 1.8 cm. Right lower uterine segment mass measuring 2 x 1.6 x 1.8 cm consistent with fibroid. Right posterior fundal mass measuring 2.4 x 2.9 x 2.6 cm. IMPRESSION: 1. Single viable intrauterine pregnancy as above. Fetal cardiac activity of 66 BPM consistent with bradycardia 2. Uterine fibroids Electronically Signed   By: Donavan Foil M.D.   On: 01/08/2019 22:30   US OB Transvaginal  Result Date: 01/13/2019 CLINICAL DATA:  Vaginal bleeding EXAM: TRANSVAGINAL OB ULTRASOUND TECHNIQUE: Transvaginal ultrasound was performed for complete evaluation of the gestation as well as the maternal uterus, adnexal regions, and pelvic cul-de-sac. COMPARISON:  January 08, 2019 FINDINGS: Gestational sac is no longer identified. Maternal uterus/adnexae: Two stable uterine fibroids are again identified. Right ovary measures 4.1 x 2.8 x 2.3 cm. Left ovary measures 3.4 x 2.9 x 2.2 cm. There is no free fluid. IMPRESSION: Gestational sac no longer identified. Electronically Signed   By: Macy Mis M.D.   On: 01/13/2019 10:49   US OB Transvaginal  Result Date: 01/08/2019 CLINICAL DATA:  Abdominal pain for 2 days EXAM: OBSTETRIC <14 WK [place] AND TRANSVAGINAL OB US TECHNIQUE: Both transabdominal and transvaginal ultrasound examinations were performed for complete evaluation of the gestation as well as the maternal uterus, adnexal regions, and pelvic cul-de-sac. Transvaginal technique was performed to assess early pregnancy. COMPARISON:  None. FINDINGS: Intrauterine gestational sac: Single Yolk sac:  Visualized. Embryo:  Visualized. Cardiac Activity: Visualized. Heart Rate: 66 bpm CRL: 2.99 mm  5 w   5 d                  [place] EDC: 09/05/2019 Subchorionic hemorrhage:  None visualized. Maternal uterus/adnexae: Ovaries are within normal limits. The right ovary measures 2.7 x 3 x 2.6 cm. The left ovary measures 2.2 x 2.8 x 1.8 cm.  Right lower uterine segment mass measuring 2 x 1.6 x 1.8 cm consistent with fibroid. Right posterior fundal mass measuring 2.4 x 2.9 x 2.6 cm. IMPRESSION: 1. Single viable intrauterine pregnancy as above. Fetal cardiac activity of 66 BPM consistent with bradycardia 2. Uterine fibroids Electronically Signed   By: Donavan Foil M.D.   On: 01/08/2019 22:30    Assessment & Plan:  SAB f/u- no complications  Fibroids- reviewed etiology and potential sx.   Reviewed all forms of birth control options available including abstinence; over the counter/barrier methods; hormonal contraceptive medication including pill, patch, ring, injection,contraceptive implant; hormonal and nonhormonal IUDs. Risks and benefits reviewed.  Questions were answered.  Information was given to patient to review. Pt was already on the patch and she wants to cont that. She had no issues prev on the patch.  Ortho Evra 1 patch monthly  F/u in 3 months to reassess fibroids and check BP on OrthoEvra.  Total face-to-face time with patient was 15 min.  Greater than 50% was spent in counseling and coordination of care with the patient.   Alandra Sando L. Harraway-Smith, M.D., Cherlynn June

## 2019-07-22 ENCOUNTER — Other Ambulatory Visit: Payer: Medicaid Other

## 2019-10-18 ENCOUNTER — Other Ambulatory Visit: Payer: Self-pay

## 2019-10-18 ENCOUNTER — Other Ambulatory Visit (HOSPITAL_COMMUNITY)
Admission: RE | Admit: 2019-10-18 | Discharge: 2019-10-18 | Disposition: A | Discharge status: Home / Self Care | Payer: BC Managed Care – PPO | Source: Ambulatory Visit | Attending: Family Medicine | Admitting: Family Medicine

## 2019-10-18 ENCOUNTER — Ambulatory Visit: Payer: Medicaid Other

## 2019-10-18 VITALS — BP 116/81 | HR 73 | Wt 161.0 lb

## 2019-10-18 DIAGNOSIS — Z113 Encounter for screening for infections with a predominantly sexual mode of transmission: Secondary | ICD-10-CM | POA: Diagnosis not present

## 2019-10-18 DIAGNOSIS — N760 Acute vaginitis: Secondary | ICD-10-CM | POA: Insufficient documentation

## 2019-10-18 DIAGNOSIS — B9689 Other specified bacterial agents as the cause of diseases classified elsewhere: Secondary | ICD-10-CM | POA: Diagnosis not present

## 2019-10-18 DIAGNOSIS — N898 Other specified noninflammatory disorders of vagina: Secondary | ICD-10-CM | POA: Diagnosis not present

## 2019-10-18 NOTE — Progress Notes (Signed)
Pt presents for STI testing.Pt states is having spotting, brown discharge, itching and abdominal pain x 1 week. Pt states she has not had any new sexual partners.  Self swab was sent to the lab. Nicole Frank l Cori Justus, CMA

## 2019-10-18 NOTE — Progress Notes (Signed)
Chart reviewed - agree with CMA/RN documentation.  ° °

## 2019-10-20 ENCOUNTER — Telehealth: Payer: Self-pay

## 2019-10-20 ENCOUNTER — Other Ambulatory Visit: Payer: Self-pay

## 2019-10-20 DIAGNOSIS — B9689 Other specified bacterial agents as the cause of diseases classified elsewhere: Secondary | ICD-10-CM

## 2019-10-20 LAB — CERVICOVAGINAL ANCILLARY ONLY
Bacterial Vaginitis (gardnerella): POSITIVE — AB
Candida Glabrata: NEGATIVE
Candida Vaginitis: NEGATIVE
Chlamydia: NEGATIVE
Comment: NEGATIVE
Comment: NEGATIVE
Comment: NEGATIVE
Comment: NEGATIVE
Comment: NEGATIVE
Comment: NORMAL
Neisseria Gonorrhea: NEGATIVE
Trichomonas: NEGATIVE

## 2019-10-20 MED ORDER — METRONIDAZOLE 0.75 % VA GEL: VAGINAL | 0 refills | Status: DC

## 2019-10-20 NOTE — Telephone Encounter (Signed)
Pt called stating that she saw her positive BV results on Mychart. Pt requests a Rx for Metrogel. Medication was sent to her pharmacy. Understanding was voiced. Iona Stay l Chareese Sergent, CMA

## 2019-12-09 ENCOUNTER — Telehealth: Payer: Self-pay

## 2019-12-09 DIAGNOSIS — B9689 Other specified bacterial agents as the cause of diseases classified elsewhere: Secondary | ICD-10-CM

## 2019-12-09 DIAGNOSIS — N760 Acute vaginitis: Secondary | ICD-10-CM

## 2019-12-09 MED ORDER — METRONIDAZOLE 500 MG PO TABS: 500.0000 mg | ORAL_TABLET | Freq: Two times a day (BID) | ORAL | 0 refills | Status: DC

## 2019-12-09 NOTE — Telephone Encounter (Signed)
Pt called stating she was treated for BV in August but she thinks the infection came back.Pt states she is having vaginal discharge with odor.  Flagyl 500 mg BID x 7 days was sent to the pt's pharmacy. I advised pt to not drink alcohol with this medication as it can make her sick. Understanding was voiced.  Luisantonio Adinolfi l Phares Zaccone, CMA

## 2020-01-06 ENCOUNTER — Emergency Department (HOSPITAL_BASED_OUTPATIENT_CLINIC_OR_DEPARTMENT_OTHER): Payer: BC Managed Care – PPO

## 2020-01-06 ENCOUNTER — Encounter (HOSPITAL_BASED_OUTPATIENT_CLINIC_OR_DEPARTMENT_OTHER): Payer: Self-pay | Admitting: *Deleted

## 2020-01-06 ENCOUNTER — Other Ambulatory Visit: Payer: Self-pay

## 2020-01-06 ENCOUNTER — Emergency Department (HOSPITAL_BASED_OUTPATIENT_CLINIC_OR_DEPARTMENT_OTHER)
Admission: EM | Admit: 2020-01-06 | Discharge: 2020-01-06 | Discharge status: Against Medical Advice | Payer: BC Managed Care – PPO | Attending: Emergency Medicine | Admitting: Emergency Medicine

## 2020-01-06 DIAGNOSIS — D259 Leiomyoma of uterus, unspecified: Secondary | ICD-10-CM | POA: Diagnosis not present

## 2020-01-06 DIAGNOSIS — Z8742 Personal history of other diseases of the female genital tract: Secondary | ICD-10-CM | POA: Insufficient documentation

## 2020-01-06 DIAGNOSIS — Z87891 Personal history of nicotine dependence: Secondary | ICD-10-CM | POA: Insufficient documentation

## 2020-01-06 DIAGNOSIS — R1031 Right lower quadrant pain: Secondary | ICD-10-CM | POA: Insufficient documentation

## 2020-01-06 HISTORY — DX: Leiomyoma of uterus, unspecified: D25.9

## 2020-01-06 LAB — BASIC METABOLIC PANEL
Anion gap: 7 (ref 5–15)
BUN: 13 mg/dL (ref 6–20)
CO2: 24 mmol/L (ref 22–32)
Calcium: 9.2 mg/dL (ref 8.9–10.3)
Chloride: 107 mmol/L (ref 98–111)
Creatinine, Ser: 0.73 mg/dL (ref 0.44–1.00)
GFR, Estimated: >60 mL/min (ref >60)
Glucose, Bld: 86 mg/dL (ref 70–99)
Potassium: 4.1 mmol/L (ref 3.5–5.1)
Sodium: 138 mmol/L (ref 135–145)

## 2020-01-06 LAB — CBC WITH DIFFERENTIAL/PLATELET
Abs Immature Granulocytes: 0.01 10*3/uL (ref 0.00–0.07)
Basophils Absolute: 0.1 10*3/uL (ref 0.0–0.1)
Basophils Relative: 1 %
Eosinophils Absolute: 0.1 10*3/uL (ref 0.0–0.5)
Eosinophils Relative: 3 %
HCT: 35.8 % — ABNORMAL LOW (ref 36.0–46.0)
Hemoglobin: 11.6 g/dL — ABNORMAL LOW (ref 12.0–15.0)
Immature Granulocytes: 0 %
Lymphocytes Relative: 43 %
Lymphs Abs: 2 10*3/uL (ref 0.7–4.0)
MCH: 28.6 pg (ref 26.0–34.0)
MCHC: 32.4 g/dL (ref 30.0–36.0)
MCV: 88.4 fL (ref 80.0–100.0)
Monocytes Absolute: 0.4 10*3/uL (ref 0.1–1.0)
Monocytes Relative: 9 %
Neutro Abs: 2 10*3/uL (ref 1.7–7.7)
Neutrophils Relative %: 44 %
Platelets: 267 10*3/uL (ref 150–400)
RBC: 4.05 MIL/uL (ref 3.87–5.11)
RDW: 12.8 % (ref 11.5–15.5)
WBC: 4.5 10*3/uL (ref 4.0–10.5)
nRBC: 0 % (ref 0.0–0.2)

## 2020-01-06 LAB — URINALYSIS, ROUTINE W REFLEX MICROSCOPIC
Bilirubin Urine: NEGATIVE
Glucose, UA: NEGATIVE mg/dL
Ketones, ur: NEGATIVE mg/dL
Leukocytes,Ua: NEGATIVE
Nitrite: NEGATIVE
Protein, ur: NEGATIVE mg/dL
Specific Gravity, Urine: 1.025 (ref 1.005–1.030)
pH: 6 (ref 5.0–8.0)

## 2020-01-06 LAB — URINALYSIS, MICROSCOPIC (REFLEX)

## 2020-01-06 LAB — PREGNANCY, URINE: Preg Test, Ur: NEGATIVE

## 2020-01-06 LAB — HCG, QUANTITATIVE, PREGNANCY: hCG, Beta Chain, Quant, S: <1 m[IU]/mL (ref <5)

## 2020-01-06 MED ORDER — IOHEXOL 300 MG/ML  SOLN: 100.0000 mL | Freq: Once | INTRAMUSCULAR | Status: DC | PRN

## 2020-01-06 NOTE — ED Provider Notes (Signed)
[facility] EMERGENCY DEPARTMENT Provider Note   CSN: [id number] Arrival date & time: 01/06/20  0750     History Chief Complaint  Patient presents with  . Abdominal Pain    Nicole Frank is a 29 y.o. female.  Patient is a 29 year old female with history of uterine fibroids, prior C-section.  She presents today for evaluation of right lower quadrant pain.  She describes abdominal pain that started yesterday and is worsening.  The pain is located in her right lower quadrant with no radiation to the flank.  It is worse when she palpates the area or changes position.  She denies any bowel or bladder complaints.  Her last menstrual period is overdue, but she doubts the possibility of pregnancy.  She tells me it is not uncommon for her to miss periods on occasion.  She denies fevers or chills.  She denies vaginal discharge.  The history is provided by the patient.  Abdominal Pain Pain location:  RLQ Pain quality: stabbing   Pain radiates to:  Does not radiate Pain severity:  Moderate Onset quality:  Gradual Duration:  24 hours Timing:  Constant Progression:  Worsening Chronicity:  New Relieved by:  Nothing Worsened by:  Palpation and movement      Past Medical History:  Diagnosis Date  . Fibroid, uterine   . Preterm labor   . Vaginal Pap smear, abnormal 2011   colpscopy done    Patient Active Problem List   Diagnosis Date Noted  . Uterine leiomyoma 01/08/2019    Past Surgical History:  Procedure Laterality Date  . CESAREAN SECTION       OB History    Gravida  7   Para  2   Term  1   Preterm  1   AB  4   Living  2     SAB  1   TAB  3   Ectopic      Multiple      Live Births  2           Family History  Problem Relation Age of Onset  . Hypertension Maternal Grandfather   . Hypertension Mother   . Stroke Mother   . Cancer Neg Hx   . Diabetes Neg Hx     Social History   Tobacco Use  . Smoking status: Former Research scientist (life sciences)  .  Smokeless tobacco: Never Used  Vaping Use  . Vaping Use: Never used  Substance Use Topics  . Alcohol use: No  . Drug use: No    Home Medications Prior to Admission medications   Medication Sig Start Date End Date Taking? Authorizing Provider  metroNIDAZOLE (FLAGYL) 500 MG tablet Take 1 tablet (500 mg total) by mouth 2 (two) times daily. 12/09/19   Lavonia Drafts, MD  metroNIDAZOLE (METROGEL VAGINAL) 0.75 % vaginal gel Place 1 Applicatorful vaginally at bedtime. Insert one applicator, at bedtime, for 5 nights. Patient not taking: Reported on 01/28/2019 01/08/19   Gavin Pound, CNM  metroNIDAZOLE (METROGEL VAGINAL) 0.75 % vaginal gel 1 applicator vaginally every night for 5 nights 10/20/19   Lavonia Drafts, MD  norelgestromin-ethinyl estradiol (ORTHO EVRA) 150-35 MCG/24HR transdermal patch Place 1 patch onto the skin once a week. Patient not taking: Reported on 10/18/2019 01/28/19   Lavonia Drafts, MD  Prenatal Vit-Fe Fumarate-FA (PRENATAL MULTIVITAMIN) TABS tablet Take 1 tablet by mouth daily at 12 noon. Patient not taking: Reported on 10/18/2019    [provider]    Allergies  Patient has no known allergies.  Review of Systems   Review of Systems  Gastrointestinal: Positive for abdominal pain.  All other systems reviewed and are negative.   Physical Exam Updated Vital Signs BP 113/81 (BP Location: Right Arm)   Pulse 76   Temp 98.6 F (37 C) (Oral)   Resp 16   Ht 5\' 6"  (1.676 m)   Wt 65.8 kg   LMP 11/30/2019   SpO2 100%   BMI 23.40 kg/m   Physical Exam Vitals and nursing note reviewed.  Constitutional:      General: She is not in acute distress.    Appearance: She is well-developed. She is not diaphoretic.  HENT:     Head: Normocephalic and atraumatic.  Cardiovascular:     Rate and Rhythm: Normal rate and regular rhythm.     Heart sounds: No murmur heard.  No friction rub. No gallop.   Pulmonary:     Effort: Pulmonary effort  is normal. No respiratory distress.     Breath sounds: Normal breath sounds. No wheezing.  Abdominal:     General: Bowel sounds are normal. There is no distension.     Palpations: Abdomen is soft.     Tenderness: There is abdominal tenderness in the right lower quadrant. There is no right CVA tenderness, left CVA tenderness, guarding or rebound.  Musculoskeletal:        General: Normal range of motion.     Cervical back: Normal range of motion and neck supple.  Skin:    General: Skin is warm and dry.  Neurological:     Mental Status: She is alert and oriented to person, place, and time.     ED Results / Procedures / Treatments   Labs (all labs ordered are listed, but only abnormal results are displayed) Labs Reviewed  PREGNANCY, URINE  URINALYSIS, ROUTINE W REFLEX MICROSCOPIC  BASIC METABOLIC PANEL  CBC WITH DIFFERENTIAL/PLATELET  HCG, QUANTITATIVE, PREGNANCY    EKG None  Radiology No results found.  Procedures Procedures (including critical care time)  Medications Ordered in ED Medications - No data to display  ED Course  I have reviewed the triage vital signs and the nursing notes.  Pertinent labs & imaging results that were available during my care of the patient were reviewed by me and considered in my medical decision making (see chart for details).    MDM Rules/Calculators/A&P  Patient presenting here with complaints of right lower quadrant pain, the etiology of which I am uncertain.  Patient's work-up today reveals no evidence for torsion of the right ovary, but there was the suggestion of possible intermittent torsion of the left ovary.  The patient is having no left-sided abdominal pain and no left lower quadrant tenderness on several reexaminations.  I suspect the findings on ultrasound are related to positioning of the ovary, not a true torsion.  I discussed this with Dr. Roselie Awkward from GYN who agrees with my assessment.  Patient has no white count and no  evidence for urinary tract infection.  My plan was to rule out appendicitis with a CT scan, however I was informed by the nurse that the patient had to be somewhere at 1130 and left the department prior to my speaking with her.  I am uncertain as to the etiology of her discomfort as she left prior to completion of her work-up.  Final Clinical Impression(s) / ED Diagnoses Final diagnoses:  RLQ abdominal pain    Rx / DC Orders ED Discharge  Orders    None       Veryl Speak, MD 01/06/20 1057

## 2020-01-06 NOTE — ED Triage Notes (Signed)
Abdominal pain since yesterday.  Denies n/v or fever.

## 2020-01-06 NOTE — ED Notes (Signed)
Pt sts she has to be somewhere at 1130, so she is leaving

## 2020-01-20 ENCOUNTER — Encounter: Payer: BC Managed Care – PPO | Admitting: Obstetrics

## 2020-01-27 ENCOUNTER — Other Ambulatory Visit: Payer: Self-pay

## 2020-01-27 DIAGNOSIS — B9689 Other specified bacterial agents as the cause of diseases classified elsewhere: Secondary | ICD-10-CM

## 2020-01-27 MED ORDER — METRONIDAZOLE 0.75 % VA GEL: VAGINAL | 0 refills | Status: DC

## 2020-01-27 NOTE — Progress Notes (Signed)
Pt called requesting a refill of Metrogel. Pt states she is having vaginal odor and discharge x 3 days. Medication was sent to her pharmacy. Giorgio Chabot l Christee Mervine, CMA

## 2020-02-02 DIAGNOSIS — E559 Vitamin D deficiency, unspecified: Secondary | ICD-10-CM | POA: Diagnosis not present

## 2020-02-02 DIAGNOSIS — Z0000 Encounter for general adult medical examination without abnormal findings: Secondary | ICD-10-CM | POA: Diagnosis not present

## 2020-02-03 ENCOUNTER — Encounter: Payer: Self-pay | Admitting: Family Medicine

## 2020-02-03 ENCOUNTER — Ambulatory Visit (INDEPENDENT_AMBULATORY_CARE_PROVIDER_SITE_OTHER): Payer: BC Managed Care – PPO | Admitting: Family Medicine

## 2020-02-03 ENCOUNTER — Other Ambulatory Visit (HOSPITAL_COMMUNITY)
Admission: RE | Admit: 2020-02-03 | Discharge: 2020-02-03 | Disposition: A | Discharge status: Home / Self Care | Payer: BC Managed Care – PPO | Source: Ambulatory Visit | Attending: Family Medicine | Admitting: Family Medicine

## 2020-02-03 ENCOUNTER — Other Ambulatory Visit: Payer: Self-pay

## 2020-02-03 VITALS — BP 112/67 | HR 73 | Ht 66.0 in | Wt 153.0 lb

## 2020-02-03 DIAGNOSIS — D259 Leiomyoma of uterus, unspecified: Secondary | ICD-10-CM

## 2020-02-03 DIAGNOSIS — Z01419 Encounter for gynecological examination (general) (routine) without abnormal findings: Secondary | ICD-10-CM | POA: Diagnosis not present

## 2020-02-03 DIAGNOSIS — N760 Acute vaginitis: Secondary | ICD-10-CM | POA: Insufficient documentation

## 2020-02-03 DIAGNOSIS — Z7721 Contact with and (suspected) exposure to potentially hazardous body fluids: Secondary | ICD-10-CM | POA: Diagnosis not present

## 2020-02-03 DIAGNOSIS — R87610 Atypical squamous cells of undetermined significance on cytologic smear of cervix (ASC-US): Secondary | ICD-10-CM | POA: Diagnosis not present

## 2020-02-03 MED ORDER — METRONIDAZOLE 0.75 % VA GEL: 1.0000 | Freq: Every day | VAGINAL | 5 refills | Status: DC

## 2020-02-03 NOTE — Progress Notes (Signed)
GYNECOLOGY ANNUAL PREVENTATIVE CARE ENCOUNTER NOTE  Subjective:   Nicole Frank is a 29 y.o. [phone] female here for a routine annual gynecologic exam.  Current complaints: cramping from fibroids - usually a few days prior to menses, always on the right side. Had [place] in Nov 2021 - 2 fibroids about 3cm in diameter. Also gets frequent BV - about every month.  Denies abnormal vaginal bleeding, discharge, pelvic pain, problems with intercourse or other gynecologic concerns.    Gynecologic History Patient's last menstrual period was 01/06/2020 (exact date). Patient is not currently sexually active  Contraception: none Last Pap: 01/2018. Results were: normal Last mammogram: n/a  Obstetric History OB History  Gravida Para Term Preterm AB Living  7 2 1 1 4 2   SAB IAB Ectopic Multiple Live Births  1 3     2     # Outcome Date GA Lbr Len/2nd Weight Sex Delivery Anes PTL Lv  7 Preterm 06/24/13 [redacted]w[redacted]d   M CS-LTranv   LIV  6 Term 12/29/09 [redacted]w[redacted]d   F Vag-Breech   LIV  5 IAB           4 Gravida           3 IAB           2 SAB           1 IAB             Past Medical History:  Diagnosis Date  . Fibroid, uterine   . Preterm labor   . Vaginal Pap smear, abnormal 2011   colpscopy done    Past Surgical History:  Procedure Laterality Date  . CESAREAN SECTION      No current outpatient medications on file prior to visit.   No current facility-administered medications on file prior to visit.    No Known Allergies  Social History   Socioeconomic History  . Marital status: Single    Spouse name: Not on file  . Number of children: Not on file  . Years of education: Not on file  . Highest education level: Not on file  Occupational History  . Not on file  Tobacco Use  . Smoking status: Former Research scientist (life sciences)  . Smokeless tobacco: Never Used  Vaping Use  . Vaping Use: Never used  Substance and Sexual Activity  . Alcohol use: No  . Drug use: No  . Sexual activity: Yes    Birth  control/protection: None  Other Topics Concern  . Not on file  Social History Narrative  . Not on file   Social Determinants of Health   Financial Resource Strain: Not on file  Food Insecurity: Not on file  Transportation Needs: Not on file  Physical Activity: Not on file  Stress: Not on file  Social Connections: Not on file  Intimate Partner Violence: Not on file    Family History  Problem Relation Age of Onset  . Hypertension Maternal Grandfather   . Hypertension Mother   . Stroke Mother   . Cancer Neg Hx   . Diabetes Neg Hx     The following portions of the patient's history were reviewed and updated as appropriate: allergies, current medications, past family history, past medical history, past social history, past surgical history and problem list.  Review of Systems Pertinent items are noted in HPI.   Objective:  BP 112/67   Pulse 73   Ht 5\' 6"  (1.676 m)   Wt 153 lb (69.4 kg)  LMP 01/06/2020 (Exact Date)   BMI 24.69 kg/m  Wt Readings from Last 3 Encounters:  02/03/20 153 lb (69.4 kg)  01/06/20 145 lb (65.8 kg)  10/18/19 161 lb (73 kg)     Chaperone present during exam  CONSTITUTIONAL: Well-developed, well-nourished female in no acute distress.  HENT:  Normocephalic, atraumatic, External right and left ear normal. Oropharynx is clear and moist EYES: Conjunctivae and EOM are normal. Pupils are equal, round, and reactive to light. No scleral icterus.  NECK: Normal range of motion, supple, no masses.  Normal thyroid.   CARDIOVASCULAR: Normal heart rate noted, regular rhythm RESPIRATORY: Clear to auscultation bilaterally. Effort and breath sounds normal, no problems with respiration noted. BREASTS: Symmetric in size. No masses, skin changes, nipple drainage, or lymphadenopathy. ABDOMEN: Soft, normal bowel sounds, no distention noted.  No tenderness, rebound or guarding.  PELVIC: Normal appearing external genitalia; normal appearing vaginal mucosa and cervix.   No abnormal discharge noted.  Normal uterine size, no other palpable masses, no uterine or adnexal tenderness. MUSCULOSKELETAL: Normal range of motion. No tenderness.  No cyanosis, clubbing, or edema.  2+ distal pulses. SKIN: Skin is warm and dry. No rash noted. Not diaphoretic. No erythema. No pallor. NEUROLOGIC: Alert and oriented to person, place, and time. Normal reflexes, muscle tone coordination. No cranial nerve deficit noted. PSYCHIATRIC: Normal mood and affect. Normal behavior. Normal judgment and thought content.  Assessment:  Annual gynecologic examination with pap smear   Plan:  1. Well Woman Exam Will follow up results of pap smear and manage accordingly. STD testing discussed. Patient requested testing - Cytology - PAP( [place])  2. Uterine leiomyoma, unspecified location Discussed treatment options. Surgical, IR ablation, medications. Currently will continue ibuprofen and tylenol  3. Recurrent vaginitis Affirm test today. Will start on metrogel and do suppression for 4 months.  4. Exposure to body fluid - HIV antibody (with reflex) - Hepatitis C Antibody - Hepatitis B Surface AntiGEN - RPR   Routine preventative health maintenance measures emphasized. Please refer to After Visit Summary for other counseling recommendations.    Loma Boston, [place] for [employer]

## 2020-02-03 NOTE — Progress Notes (Signed)
Patient presents for annual exam

## 2020-02-04 DIAGNOSIS — Z20822 Contact with and (suspected) exposure to covid-19: Secondary | ICD-10-CM | POA: Diagnosis not present

## 2020-02-04 DIAGNOSIS — R519 Headache, unspecified: Secondary | ICD-10-CM | POA: Diagnosis not present

## 2020-02-04 LAB — HEPATITIS C ANTIBODY: Hep C Virus Ab: <0.1 s/co ratio (ref 0.0–0.9)

## 2020-02-04 LAB — CERVICOVAGINAL ANCILLARY ONLY
Bacterial Vaginitis (gardnerella): POSITIVE — AB
Candida Glabrata: NEGATIVE
Candida Vaginitis: NEGATIVE
Comment: NEGATIVE
Comment: NEGATIVE
Comment: NEGATIVE

## 2020-02-04 LAB — HEPATITIS B SURFACE ANTIGEN: Hepatitis B Surface Ag: NEGATIVE

## 2020-02-04 LAB — HIV ANTIBODY (ROUTINE TESTING W REFLEX): HIV Screen 4th Generation wRfx: NONREACTIVE

## 2020-02-04 LAB — RPR: RPR Ser Ql: NONREACTIVE

## 2020-02-08 LAB — CYTOLOGY - PAP
Chlamydia: NEGATIVE
Comment: NEGATIVE
Comment: NEGATIVE
Comment: NORMAL
Diagnosis: UNDETERMINED — AB
High risk HPV: NEGATIVE
Neisseria Gonorrhea: NEGATIVE

## 2020-02-09 DIAGNOSIS — R430 Anosmia: Secondary | ICD-10-CM | POA: Diagnosis not present

## 2020-02-09 DIAGNOSIS — Z1152 Encounter for screening for COVID-19: Secondary | ICD-10-CM | POA: Diagnosis not present

## 2020-02-10 ENCOUNTER — Encounter: Payer: Self-pay | Admitting: Family Medicine

## 2020-02-10 DIAGNOSIS — R87610 Atypical squamous cells of undetermined significance on cytologic smear of cervix (ASC-US): Secondary | ICD-10-CM | POA: Insufficient documentation

## 2020-04-12 DIAGNOSIS — L658 Other specified nonscarring hair loss: Secondary | ICD-10-CM | POA: Diagnosis not present

## 2020-05-31 ENCOUNTER — Other Ambulatory Visit: Payer: Self-pay

## 2020-05-31 ENCOUNTER — Other Ambulatory Visit (HOSPITAL_COMMUNITY)
Admission: RE | Admit: 2020-05-31 | Discharge: 2020-05-31 | Disposition: A | Discharge status: Home / Self Care | Payer: BC Managed Care – PPO | Source: Ambulatory Visit | Attending: Family Medicine | Admitting: Family Medicine

## 2020-05-31 ENCOUNTER — Ambulatory Visit (INDEPENDENT_AMBULATORY_CARE_PROVIDER_SITE_OTHER): Payer: BC Managed Care – PPO | Admitting: Family Medicine

## 2020-05-31 ENCOUNTER — Encounter: Payer: Self-pay | Admitting: Family Medicine

## 2020-05-31 VITALS — BP 113/64 | HR 80 | Wt 154.0 lb

## 2020-05-31 DIAGNOSIS — N760 Acute vaginitis: Secondary | ICD-10-CM | POA: Diagnosis not present

## 2020-05-31 MED ORDER — METRONIDAZOLE 0.75 % VA GEL: 1.0000 | Freq: Every day | VAGINAL | 5 refills | Status: AC

## 2020-05-31 NOTE — Progress Notes (Signed)
   Subjective:    Patient ID: Nicole Frank, female    DOB: 10/10/1990, 30 y.o.   MRN: [id number]  HPI Patient seen for f/u of recurrent BV. Has been using the metrogel twice a week and still feels like she is getting recurrent BV. Usually happens after menses. Did use boric acid once and it seemed to help.   Review of Systems     Objective:   Physical Exam Vitals reviewed. Exam conducted with a chaperone present.  Constitutional:      Appearance: Normal appearance.  Abdominal:     Hernia: There is no hernia in the left inguinal area or right inguinal area.  Genitourinary:    Labia:        Right: No rash, tenderness or lesion.        Left: No rash, tenderness or lesion.      Vagina: No signs of injury and foreign body. Vaginal discharge (thin white) present. No erythema, tenderness or bleeding.  Lymphadenopathy:     Lower Body: No right inguinal adenopathy. No left inguinal adenopathy.  Skin:    General: Skin is warm and dry.     Capillary Refill: Capillary refill takes less than 2 seconds.  Neurological:     General: No focal deficit present.  Psychiatric:        Mood and Affect: Mood normal.        Behavior: Behavior normal.        Thought Content: Thought content normal.        Judgment: Judgment normal.        Assessment & Plan:  1. Recurrent vaginitis Culture today. Continue metrogel. Start boric acid. Mild soaps, liners without fragrences, cotton underwear, etc.

## 2020-05-31 NOTE — Addendum Note (Signed)
Addended by: Phill Myron on: 05/31/2020 10:33 AM   Modules accepted: Orders

## 2020-05-31 NOTE — Progress Notes (Signed)
Patient presents for follow up of recurrent bacterial vaginaosis

## 2020-06-01 LAB — CERVICOVAGINAL ANCILLARY ONLY
Bacterial Vaginitis (gardnerella): POSITIVE — AB
Candida Glabrata: NEGATIVE
Candida Vaginitis: NEGATIVE
Comment: NEGATIVE
Comment: NEGATIVE
Comment: NEGATIVE

## 2020-06-05 DIAGNOSIS — F331 Major depressive disorder, recurrent, moderate: Secondary | ICD-10-CM | POA: Diagnosis not present

## 2020-11-14 DIAGNOSIS — Z01419 Encounter for gynecological examination (general) (routine) without abnormal findings: Secondary | ICD-10-CM | POA: Diagnosis not present

## 2020-11-16 DIAGNOSIS — Z7689 Persons encountering health services in other specified circumstances: Secondary | ICD-10-CM | POA: Diagnosis not present

## 2020-11-16 DIAGNOSIS — R519 Headache, unspecified: Secondary | ICD-10-CM | POA: Diagnosis not present

## 2020-11-16 DIAGNOSIS — R42 Dizziness and giddiness: Secondary | ICD-10-CM | POA: Diagnosis not present

## 2020-11-16 DIAGNOSIS — Z23 Encounter for immunization: Secondary | ICD-10-CM | POA: Diagnosis not present

## 2020-11-16 DIAGNOSIS — Z0000 Encounter for general adult medical examination without abnormal findings: Secondary | ICD-10-CM | POA: Diagnosis not present
